# Patient Record
Sex: Male | Born: 1960 | Race: White | Hispanic: No | Marital: Married | Smoking: Never smoker
Health system: Southern US, Community
[De-identification: ages and names within clinical notes are randomized; demographics above are authoritative.]

## PROBLEM LIST (undated history)

## (undated) DIAGNOSIS — E785 Hyperlipidemia, unspecified: Secondary | ICD-10-CM

## (undated) DIAGNOSIS — I639 Cerebral infarction, unspecified: Secondary | ICD-10-CM

## (undated) DIAGNOSIS — R030 Elevated blood-pressure reading, without diagnosis of hypertension: Secondary | ICD-10-CM

## (undated) DIAGNOSIS — K219 Gastro-esophageal reflux disease without esophagitis: Secondary | ICD-10-CM

## (undated) DIAGNOSIS — K644 Residual hemorrhoidal skin tags: Secondary | ICD-10-CM

## (undated) DIAGNOSIS — G459 Transient cerebral ischemic attack, unspecified: Secondary | ICD-10-CM

## (undated) HISTORY — PX: HERNIA REPAIR: SHX51

## (undated) HISTORY — DX: Transient cerebral ischemic attack, unspecified: G45.9

## (undated) HISTORY — DX: Elevated blood-pressure reading, without diagnosis of hypertension: R03.0

## (undated) HISTORY — PX: TONSILLECTOMY: SUR1361

## (undated) HISTORY — DX: Hyperlipidemia, unspecified: E78.5

## (undated) HISTORY — DX: Cerebral infarction, unspecified: I63.9

## (undated) HISTORY — DX: Gastro-esophageal reflux disease without esophagitis: K21.9

## (undated) HISTORY — DX: Residual hemorrhoidal skin tags: K64.4

---

## 2007-05-16 ENCOUNTER — Emergency Department (HOSPITAL_COMMUNITY): Admission: EM | Admit: 2007-05-16 | Discharge: 2007-05-17 | Payer: Self-pay | Admitting: Emergency Medicine

## 2008-08-21 LAB — HM COLONOSCOPY

## 2009-03-22 ENCOUNTER — Ambulatory Visit: Payer: Self-pay | Admitting: Family Medicine

## 2009-03-24 LAB — CONVERTED CEMR LAB
ALT: 25 units/L (ref 0–53)
Albumin: 4.1 g/dL (ref 3.5–5.2)
Alkaline Phosphatase: 54 units/L (ref 39–117)
BUN: 11 mg/dL (ref 6–23)
Bilirubin, Direct: 0.1 mg/dL (ref 0.0–0.3)
Calcium: 9.2 mg/dL (ref 8.4–10.5)
Direct LDL: 154.1 mg/dL
Eosinophils Absolute: 0.1 10*3/uL (ref 0.0–0.7)
Eosinophils Relative: 1.1 % (ref 0.0–5.0)
HCT: 46.2 % (ref 39.0–52.0)
Lymphs Abs: 1.6 10*3/uL (ref 0.7–4.0)
MCHC: 34.5 g/dL (ref 30.0–36.0)
MCV: 90.6 fL (ref 78.0–100.0)
Platelets: 257 10*3/uL (ref 150.0–400.0)
Potassium: 4.5 meq/L (ref 3.5–5.1)
RBC: 5.1 M/uL (ref 4.22–5.81)
RDW: 12.1 % (ref 11.5–14.6)
TSH: 2.26 microintl units/mL (ref 0.35–5.50)
Total CHOL/HDL Ratio: 6
Triglycerides: 241 mg/dL — ABNORMAL HIGH (ref 0.0–149.0)
WBC: 8.2 10*3/uL (ref 4.5–10.5)

## 2009-04-04 ENCOUNTER — Ambulatory Visit: Payer: Self-pay | Admitting: Cardiology

## 2009-04-04 ENCOUNTER — Inpatient Hospital Stay (HOSPITAL_COMMUNITY): Admission: EM | Admit: 2009-04-04 | Discharge: 2009-04-07 | Payer: Self-pay | Admitting: Emergency Medicine

## 2009-04-05 ENCOUNTER — Encounter (INDEPENDENT_AMBULATORY_CARE_PROVIDER_SITE_OTHER): Payer: Self-pay | Admitting: Internal Medicine

## 2009-04-05 ENCOUNTER — Ambulatory Visit: Payer: Self-pay | Admitting: Vascular Surgery

## 2009-04-21 ENCOUNTER — Ambulatory Visit: Payer: Self-pay | Admitting: Gastroenterology

## 2009-04-21 ENCOUNTER — Ambulatory Visit: Payer: Self-pay | Admitting: Family Medicine

## 2009-04-21 DIAGNOSIS — G459 Transient cerebral ischemic attack, unspecified: Secondary | ICD-10-CM | POA: Insufficient documentation

## 2009-04-21 DIAGNOSIS — R030 Elevated blood-pressure reading, without diagnosis of hypertension: Secondary | ICD-10-CM

## 2009-04-21 DIAGNOSIS — E785 Hyperlipidemia, unspecified: Secondary | ICD-10-CM

## 2009-04-21 HISTORY — DX: Transient cerebral ischemic attack, unspecified: G45.9

## 2009-04-21 HISTORY — DX: Hyperlipidemia, unspecified: E78.5

## 2009-04-21 HISTORY — DX: Elevated blood-pressure reading, without diagnosis of hypertension: R03.0

## 2009-05-19 ENCOUNTER — Ambulatory Visit: Payer: Self-pay | Admitting: Gastroenterology

## 2010-02-09 ENCOUNTER — Ambulatory Visit: Payer: Self-pay | Admitting: Family Medicine

## 2010-02-09 DIAGNOSIS — K644 Residual hemorrhoidal skin tags: Secondary | ICD-10-CM | POA: Insufficient documentation

## 2010-02-09 HISTORY — DX: Residual hemorrhoidal skin tags: K64.4

## 2010-05-09 ENCOUNTER — Telehealth: Payer: Self-pay | Admitting: Family Medicine

## 2010-06-01 ENCOUNTER — Encounter: Payer: Self-pay | Admitting: Family Medicine

## 2010-06-10 ENCOUNTER — Encounter: Payer: Self-pay | Admitting: Family Medicine

## 2010-06-10 ENCOUNTER — Ambulatory Visit: Payer: Self-pay | Admitting: Family Medicine

## 2010-07-26 NOTE — Assessment & Plan Note (Signed)
Summary: ?rectal lesion/njr   Vital Signs:  Patient profile:   50 year old male Weight:      226 pounds Temp:     98.9 degrees F oral BP sitting:   110 / 82  (left arm) Cuff size:   regular  Vitals Entered By: Kathrynn Speed CMA (February 09, 2010 1:54 PM) CC: Rectal lesion, src   History of Present Illness: Patient seen with swelling perianal region which he noticed a few days ago. History of recent colonoscopy unremarkable. Minimal if any pain. No bleeding. History of hemorrhoids.  No signif constipation.  Patient has had prior TIA. Questions regarding blood pressure. Stable with home BPs around 120/70. Compliant with Norvasc 2.5 mg daily. Wonders if he needs to maintain medication.  Poor compliance with exercise recently.  NO recurrent TIA symptoms.  Hyperlipidemia treated with Zocor 20 mg daily. Needs followup labs. Has some fatigue issues but no muscle aches.  Current Medications (verified): 1)  No Medications 2)  Norvasc 2.5 Mg Tabs (Amlodipine Besylate) .... Once Daily 3)  Zocor 20 Mg Tabs (Simvastatin) .... Once Daily 4)  Aspirin 325 Mg Tabs (Aspirin) .... Once Daily 5)  Prilosec 20 Mg Cpdr (Omeprazole) .... Twice A Day 6)  Benzonatate 100 Mg Caps (Benzonatate) .... 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    Physical Exam  General:  Well-developed,well-nourished,in no acute distress; alert,appropriate and cooperative throughout examination Head:  Normocephalic and atraumatic without obvious abnormalities. No apparent alopecia or balding. Mouth:  Oral mucosa and oropharynx without lesions or exudates.  Teeth in good repair. Neck:  No deformities, masses, or tenderness noted. Lungs:  Normal respiratory effort, chest expands symmetrically. Lungs are clear to auscultation, no crackles or wheezes. Heart:  Normal rate and regular rhythm. S1 and S2 normal without gallop, murmur, click, rub or other extra sounds. Rectal:  External hemorroid around the 2 O'Clock pos R side-nonthrombosed.   Extremities:  No clubbing, cyanosis, edema, or deformity noted with normal full range of motion of all joints.   Skin:  no rashes.   Cervical Nodes:  No lymphadenopathy noted Psych:  Oriented X3, memory intact for recent and remote, and normally interactive.     Impression & Recommendations:  Problem # 1:  HEMORRHOIDS, EXTERNAL (ICD-455.3) measures to prevent constipation discussed. Warm baths and observe.  Problem # 2:  ELEVATED BLOOD PRESSURE (ICD-796.2) Discussed trial off BP med and close following. His updated medication list for this problem includes:    Norvasc 2.5 Mg Tabs (Amlodipine besylate) ..... Once daily  Problem # 3:  TIA (ICD-435.9) schedule labs for CPE. His updated medication list for this problem includes:    Aspirin 325 Mg Tabs (Aspirin) ..... Once daily  Problem # 4:  DYSLIPIDEMIA (ICD-272.4)  His updated medication list for this problem includes:    Zocor  20 Mg Tabs (Simvastatin) ..... Once daily  Complete Medication List: 1)  No Medications  2)  Norvasc 2.5 Mg Tabs (Amlodipine besylate) .... Once daily 3)  Zocor 20 Mg  Tabs (Simvastatin) .... Once daily 4)  Aspirin 325 Mg Tabs (Aspirin) .... Once daily 5)  Prilosec 20 Mg Cpdr (Omeprazole) .... Twice a day 6)  Benzonatate 100 Mg Caps (Benzonatate) .... One in the morning & one at night  Patient Instructions: 1)  Schedule CPE.

## 2010-07-26 NOTE — Progress Notes (Signed)
Summary: REQ FOR ORDER, labs for CPX  Phone Note Call from Patient   Caller: Patient    [phone] Summary of Call: Pt adv that he would like to have an order for cpx labwork faxed to him at   [phone].Marland Kitchen.(pt scheduled for cpx on 12/16 with Dr Caryl Never)..... Pt adv that he works for [employer] they have a lab in house and he can have his labs drawn there if he has an order... Also, pt adv he had TIA last year and wants to know if there are any labs that need to be ordered in addition to cpx labwork...Marland KitchenMarland KitchenMarland Kitchen Pt can be reached at [phone].  Initial call taken by: Debbra Riding,  May 09, 2010 2:58 PM  Follow-up for Phone Call        will write out order. Follow-up by: Evelena Peat MD,  May 09, 2010 5:33 PM  Additional Follow-up for Phone Call Additional follow up Details #1::        Rx faxed, confirmation received, pt informed on VM Additional Follow-up by: Sid Falcon LPN,  May 10, 2010 9:11 AM

## 2010-07-28 NOTE — Assessment & Plan Note (Signed)
Summary: CPX // RS   Vital Signs:  Patient profile:   50 year old male Height:      70.75 inches Weight:      221 pounds BMI:     31.15 Pulse rate:   80 / minute Pulse rhythm:   regular Resp:     12 per minute BP sitting:   120 / 78  (left arm) Cuff size:   regular  Vitals Entered By: Sid Falcon LPN (June 10, 2010 2:05 PM)  Nutrition Counseling: Patient's BMI is greater than 25 and therefore counseled on weight management options.  History of Present Illness: Here for CPE.  Hx TIA one year ago with no recurrent symptoms. Stopped Amlodipine sec to good BP off meds. Recent labs through work and  mostly favorable. Triglycerides were slightly elev with HDL of 39. LDL at goal (99).  Clinical Review Panels:  Prevention   Last Colonoscopy:  DONE (05/19/2009)   Last PSA:  1.23 (03/22/2009)  Immunizations   Last Tetanus Booster:  Tdap (03/22/2009)   Last Flu Vaccine:  Historical (04/26/2010)   Allergies (verified): No Known Drug Allergies  Past History:  Past Medical History: Last updated: 04/21/2009 Migraines TIA 10/10  Past Surgical History: Last updated: 03/22/2009 Tonsillectomy  Family History: Last updated: 06/10/2010 Family History of Arthritis Family History Breast cancer Mother Family History of Colon CA Aunt Family History High cholesterol Family History Hypertension  Both parents Family history heart disease Father CABG 40  Social History: Last updated: 03/22/2009 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 Alcohol use-yes Regular exercise-yes  Risk Factors: Exercise: yes (03/22/2009)  Risk Factors: Smoking Status: never (03/22/2009) PMH-FH-SH reviewed for relevance  Family History: Family History of Arthritis Family History Breast cancer Mother Family History of Colon CA Aunt Family History High cholesterol Family History Hypertension  Both parents Family history heart disease Father CABG 41  Review of Systems  The patient  denies anorexia, fever, weight loss, weight gain, vision loss, decreased hearing, hoarseness, chest pain, syncope, dyspnea on exertion, peripheral edema, prolonged cough, headaches, hemoptysis, abdominal pain, melena, hematochezia, severe indigestion/heartburn, hematuria, incontinence, genital sores, muscle weakness, suspicious skin lesions, transient blindness, difficulty walking, depression, unusual weight change, abnormal bleeding, enlarged lymph nodes, and testicular masses.         Some continued fatigue issues otherwise feels well.  Physical Exam  General:  Well-developed,well-nourished,in no acute distress; alert,appropriate and cooperative throughout examination Eyes:  No corneal or conjunctival inflammation noted. EOMI. Perrla. Funduscopic exam benign, without hemorrhages, exudates or papilledema. Vision grossly normal. Ears:  External ear exam shows no significant lesions or deformities.  Otoscopic examination reveals clear canals, tympanic membranes are intact bilaterally without bulging, retraction, inflammation or discharge. Hearing is grossly normal bilaterally. Mouth:  Oral mucosa and oropharynx without lesions or exudates.  Teeth in good repair. Neck:  No deformities, masses, or tenderness noted. Lungs:  Normal respiratory effort, chest expands symmetrically. Lungs are clear to auscultation, no crackles or wheezes. Heart:  Normal rate and regular rhythm. S1 and S2 normal without gallop, murmur, click, rub or other extra sounds. Abdomen:  Bowel sounds positive,abdomen soft and non-tender without masses, organomegaly or hernias noted. Msk:  No deformity or scoliosis noted of thoracic or lumbar spine.   Extremities:  No clubbing, cyanosis, edema, or deformity noted with normal full range of motion of all joints.   Skin:  Intact without suspicious lesions or rashes Cervical Nodes:  No lymphadenopathy noted Psych:  normally interactive, good eye contact, not anxious appearing,  and not  depressed appearing.  normally interactive, good eye contact, not anxious appearing, and not depressed appearing.     Impression & Recommendations:  Problem # 1:  ROUTINE GENERAL MEDICAL EXAM@[email or web address]  CARE FACL (ICD-V70.0)  Add omega 3 .  Work on weight loss. Baseline EKG.  EKG shows no acute findings.  Orders: EKG w/ Interpretation (93000)  Complete Medication List: 1)  Zocor 20 Mg Tabs (Simvastatin) .... Once daily 2)  Aspirin 325 Mg Tabs (Aspirin) .... Once daily 3)  Prilosec 20 Mg Cpdr (Omeprazole) .... Twice a day  Patient Instructions: 1)  Consider add omega 3 (fish oil), 2 to 3 gms daily. 2)  It is important that you exercise reguarly at least 20 minutes 5 times a week. If you develop chest pain, have severe difficulty breathing, or feel very tired, stop exercising immediately and seek medical attention.    Orders Added: 1)  Est. Patient 40-64 years [99396] 2)  EKG w/ Interpretation [93000]   Immunization History:  Influenza Immunization History:    Influenza:  historical (04/26/2010)   Immunization History:  Influenza Immunization History:    Influenza:  Historical (04/26/2010)

## 2010-07-28 NOTE — Letter (Signed)
Summary: Health Care Provider Form-Biometric Screening Results  Health Care Provider Form-Biometric Screening Results   Imported By: Maryln Gottron 07/05/2010 09:18:12  _____________________________________________________________________  External Attachment:    Type:   Image     Comment:   External Document

## 2010-09-29 LAB — POCT I-STAT, CHEM 8
BUN: 14 mg/dL (ref 6–23)
Calcium, Ion: 1.13 mmol/L (ref 1.12–1.32)
Chloride: 104 meq/L (ref 96–112)
Creatinine, Ser: 0.9 mg/dL (ref 0.4–1.5)
Glucose, Bld: 94 mg/dL (ref 70–99)
HCT: 45 % (ref 39.0–52.0)
Hemoglobin: 15.3 g/dL (ref 13.0–17.0)
Potassium: 4.2 meq/L (ref 3.5–5.1)
Sodium: 140 meq/L (ref 135–145)
TCO2: 26 mmol/L (ref 0–100)

## 2010-09-29 LAB — PROTIME-INR
INR: 0.94 (ref 0.00–1.49)
Prothrombin Time: 12.5 s (ref 11.6–15.2)

## 2010-09-29 LAB — URINALYSIS, ROUTINE W REFLEX MICROSCOPIC
Bilirubin Urine: NEGATIVE
Glucose, UA: NEGATIVE mg/dL
Hgb urine dipstick: NEGATIVE
Ketones, ur: NEGATIVE mg/dL
Nitrite: NEGATIVE
Protein, ur: NEGATIVE mg/dL
Specific Gravity, Urine: 1.019 (ref 1.005–1.030)
Urobilinogen, UA: 0.2 mg/dL (ref 0.0–1.0)
pH: 6 (ref 5.0–8.0)

## 2010-09-29 LAB — LUPUS ANTICOAGULANT PANEL
PTT Lupus Anticoagulant: 44.3 secs — ABNORMAL HIGH (ref 32.0–43.4)
PTTLA 4:1 Mix: 42.2 secs (ref 36.3–48.8)
dRVVT Incubated 1:1 Mix: 38.8 secs (ref 36.1–47.0)

## 2010-09-29 LAB — ANA: Anti Nuclear Antibody(ANA): NEGATIVE

## 2010-09-29 LAB — DIFFERENTIAL
Basophils Absolute: 0.1 K/uL (ref 0.0–0.1)
Basophils Relative: 1 % (ref 0–1)
Eosinophils Absolute: 0.1 K/uL (ref 0.0–0.7)
Eosinophils Relative: 1 % (ref 0–5)
Lymphocytes Relative: 23 % (ref 12–46)
Lymphs Abs: 2.3 K/uL (ref 0.7–4.0)
Monocytes Absolute: 0.8 K/uL (ref 0.1–1.0)
Monocytes Relative: 8 % (ref 3–12)
Neutro Abs: 6.7 K/uL (ref 1.7–7.7)
Neutrophils Relative %: 67 % (ref 43–77)

## 2010-09-29 LAB — LIPID PANEL
HDL: 35 mg/dL — ABNORMAL LOW
Total CHOL/HDL Ratio: 5.6 ratio
Triglycerides: 296 mg/dL — ABNORMAL HIGH
VLDL: 59 mg/dL — ABNORMAL HIGH (ref 0–40)

## 2010-09-29 LAB — COMPREHENSIVE METABOLIC PANEL WITH GFR
ALT: 42 U/L (ref 0–53)
AST: 36 U/L (ref 0–37)
Albumin: 4.1 g/dL (ref 3.5–5.2)
Alkaline Phosphatase: 61 U/L (ref 39–117)
BUN: 10 mg/dL (ref 6–23)
CO2: 26 meq/L (ref 19–32)
Calcium: 8.9 mg/dL (ref 8.4–10.5)
Chloride: 104 meq/L (ref 96–112)
Creatinine, Ser: 0.92 mg/dL (ref 0.4–1.5)
GFR calc non Af Amer: >60 mL/min
Glucose, Bld: 100 mg/dL — ABNORMAL HIGH (ref 70–99)
Potassium: 4 meq/L (ref 3.5–5.1)
Sodium: 140 meq/L (ref 135–145)
Total Bilirubin: 0.9 mg/dL (ref 0.3–1.2)
Total Protein: 7.8 g/dL (ref 6.0–8.3)

## 2010-09-29 LAB — CBC
HCT: 42.7 % (ref 39.0–52.0)
Hemoglobin: 14.6 g/dL (ref 13.0–17.0)
MCHC: 34.1 g/dL (ref 30.0–36.0)
MCV: 90.2 fL (ref 78.0–100.0)
Platelets: 287 K/uL (ref 150–400)
RBC: 4.73 MIL/uL (ref 4.22–5.81)
RDW: 12 % (ref 11.5–15.5)
WBC: 10.1 K/uL (ref 4.0–10.5)

## 2010-09-29 LAB — PHOSPHORUS: Phosphorus: 3.9 mg/dL (ref 2.3–4.6)

## 2010-09-29 LAB — BETA-2-GLYCOPROTEIN I ABS, IGG/M/A
Beta-2 Glyco I IgG: <3 U/mL
Beta-2-Glycoprotein I IgA: 4 U/mL
Beta-2-Glycoprotein I IgM: <3 U/mL

## 2010-09-29 LAB — PROTEIN C ACTIVITY: Protein C Activity: 200 % — ABNORMAL HIGH (ref 75–133)

## 2010-09-29 LAB — ANTITHROMBIN III: AntiThromb III Func: 116 % (ref 76–126)

## 2010-09-29 LAB — CARDIOLIPIN ANTIBODIES, IGG, IGM, IGA
Anticardiolipin IgA: 8 U/mL — ABNORMAL LOW
Anticardiolipin IgG: 3 GPL U/mL — ABNORMAL LOW
Anticardiolipin IgM: <3 [MPL'U]/mL — ABNORMAL LOW

## 2010-09-29 LAB — PROTHROMBIN GENE MUTATION

## 2010-09-29 LAB — URINE CULTURE

## 2010-09-29 LAB — APTT: aPTT: 28 s (ref 24–37)

## 2010-09-29 LAB — HOMOCYSTEINE: Homocysteine: 10.8 umol/L (ref 4.0–15.4)

## 2010-09-29 LAB — PROTEIN S ACTIVITY: Protein S Activity: 112 % (ref 69–129)

## 2011-05-27 ENCOUNTER — Other Ambulatory Visit: Payer: Self-pay | Admitting: Family Medicine

## 2011-07-11 ENCOUNTER — Other Ambulatory Visit: Payer: Self-pay | Admitting: Family Medicine

## 2011-07-13 ENCOUNTER — Telehealth: Payer: Self-pay | Admitting: Family Medicine

## 2011-07-13 MED ORDER — SIMVASTATIN 20 MG PO TABS: 20.0000 mg | ORAL_TABLET | Freq: Every day | ORAL | Status: DC

## 2011-07-13 NOTE — Telephone Encounter (Signed)
[facility]. Pt has made appt for 08-22-2011. Pt is out of meds.

## 2011-08-07 ENCOUNTER — Telehealth: Payer: Self-pay | Admitting: Family Medicine

## 2011-08-07 NOTE — Telephone Encounter (Signed)
Patient is requesting an order for cpx labs be sent to his job tomorrow. Faxed attention Tammy: [phone]. [place] will be leaving the country and his cpx is scheduled for 08/22/11.

## 2011-08-08 ENCOUNTER — Telehealth: Payer: Self-pay | Admitting: *Deleted

## 2011-08-08 NOTE — Telephone Encounter (Signed)
Written order faxed, confirmation received 

## 2011-08-08 NOTE — Telephone Encounter (Signed)
Cbc, bmp, ua, lipid, hepatic, anything else?

## 2011-08-08 NOTE — Telephone Encounter (Signed)
Also add TSH and PSA

## 2011-08-18 ENCOUNTER — Encounter: Payer: Self-pay | Admitting: Family Medicine

## 2011-08-22 ENCOUNTER — Encounter: Payer: Self-pay | Admitting: Family Medicine

## 2011-08-22 ENCOUNTER — Ambulatory Visit (INDEPENDENT_AMBULATORY_CARE_PROVIDER_SITE_OTHER): Payer: Managed Care, Other (non HMO) | Admitting: Family Medicine

## 2011-08-22 VITALS — BP 108/68 | HR 72 | Temp 98.3°F | Resp 12 | Ht 71.75 in | Wt 215.0 lb

## 2011-08-22 DIAGNOSIS — Z0000 Encounter for general adult medical examination without abnormal findings: Secondary | ICD-10-CM

## 2011-08-22 MED ORDER — SILDENAFIL CITRATE 100 MG PO TABS: 50.0000 mg | ORAL_TABLET | Freq: Every day | ORAL | Status: DC | PRN

## 2011-08-22 NOTE — Progress Notes (Signed)
  Subjective:    Patient ID: Chris Mendez, male    DOB: Jun 15, 1961, 51 y.o.   MRN: [id number]  HPI  Patient here for complete physical. He is exercising regularly and excellent job with lifestyle management. Has lost some weight. Still has some issues with building muscle mass and has questioned low testosterone. Occasional problems with erectile dysfunction. Good libido. Nonsmoker. Immunizations up to date. Colonoscopy up to date.  Past Medical History  Diagnosis Date  . DYSLIPIDEMIA 04/21/2009  . TIA 04/21/2009  . HEMORRHOIDS, EXTERNAL 02/09/2010  . ELEVATED BLOOD PRESSURE 04/21/2009   Past Surgical History  Procedure Date  . Tonsillectomy     reports that he has never smoked. He does not have any smokeless tobacco history on file. His alcohol and drug histories not on file. family history includes Cancer in his maternal aunt and mother; Heart disease (age of onset:67) in his father; Hyperlipidemia in his father; and Hypertension in his father and mother. No Known Allergies   Review of Systems  Constitutional: Negative for fever, activity change, appetite change and fatigue.  HENT: Negative for ear pain, congestion and trouble swallowing.   Eyes: Negative for pain and visual disturbance.  Respiratory: Negative for cough, shortness of breath and wheezing.   Cardiovascular: Negative for chest pain and palpitations.  Gastrointestinal: Negative for nausea, vomiting, abdominal pain, diarrhea, constipation, blood in stool, abdominal distention and rectal pain.  Genitourinary: Negative for dysuria, hematuria and testicular pain.  Musculoskeletal: Negative for joint swelling and arthralgias.  Skin: Negative for rash.  Neurological: Negative for dizziness, syncope and headaches.  Hematological: Negative for adenopathy.  Psychiatric/Behavioral: Negative for confusion and dysphoric mood.       Objective:   Physical Exam  Constitutional: He is oriented to person, place, and time. He  appears well-developed and well-nourished. No distress.  HENT:  Head: Normocephalic and atraumatic.  Right Ear: External ear normal.  Left Ear: External ear normal.  Mouth/Throat: Oropharynx is clear and moist.  Eyes: Conjunctivae and EOM are normal. Pupils are equal, round, and reactive to light.  Neck: Normal range of motion. Neck supple. No thyromegaly present.  Cardiovascular: Normal rate, regular rhythm and normal heart sounds.   No murmur heard. Pulmonary/Chest: No respiratory distress. He has no wheezes. He has no rales.  Abdominal: Soft. Bowel sounds are normal. He exhibits no distension and no mass. There is no tenderness. There is no rebound and no guarding.  Musculoskeletal: He exhibits no edema.  Lymphadenopathy:    He has no cervical adenopathy.  Neurological: He is alert and oriented to person, place, and time. He displays normal reflexes. No cranial nerve deficit.  Skin: No rash noted.  Psychiatric: He has a normal mood and affect.          Assessment & Plan:  Complete physical. Labs reviewed with patient. Lipid parameters have improved. Continue with antilipid therapy and omega-3 supplement. Patient has erectile dysfunction. Trial of Viagra 100 mg once daily as needed

## 2011-08-22 NOTE — Patient Instructions (Signed)
Continue with weight loss and exercise efforts. 

## 2011-08-23 ENCOUNTER — Encounter: Payer: Self-pay | Admitting: Family Medicine

## 2011-08-28 MED ORDER — SIMVASTATIN 20 MG PO TABS: 20.0000 mg | ORAL_TABLET | Freq: Every day | ORAL | Status: DC

## 2011-08-28 NOTE — Telephone Encounter (Signed)
Pt needs viagra 100mg  and simvastatin 20 mg call [facility] [address]

## 2011-10-02 ENCOUNTER — Encounter (INDEPENDENT_AMBULATORY_CARE_PROVIDER_SITE_OTHER): Payer: Managed Care, Other (non HMO) | Admitting: Ophthalmology

## 2011-10-02 DIAGNOSIS — H2510 Age-related nuclear cataract, unspecified eye: Secondary | ICD-10-CM

## 2011-10-02 DIAGNOSIS — H5210 Myopia, unspecified eye: Secondary | ICD-10-CM

## 2011-10-02 DIAGNOSIS — H43819 Vitreous degeneration, unspecified eye: Secondary | ICD-10-CM

## 2011-10-02 DIAGNOSIS — H3560 Retinal hemorrhage, unspecified eye: Secondary | ICD-10-CM

## 2011-11-13 ENCOUNTER — Encounter (INDEPENDENT_AMBULATORY_CARE_PROVIDER_SITE_OTHER): Payer: Managed Care, Other (non HMO) | Admitting: Ophthalmology

## 2011-11-13 DIAGNOSIS — H43819 Vitreous degeneration, unspecified eye: Secondary | ICD-10-CM

## 2011-11-13 DIAGNOSIS — H2510 Age-related nuclear cataract, unspecified eye: Secondary | ICD-10-CM

## 2012-06-11 ENCOUNTER — Other Ambulatory Visit: Payer: Self-pay | Admitting: *Deleted

## 2012-06-11 MED ORDER — SIMVASTATIN 20 MG PO TABS: 20.0000 mg | ORAL_TABLET | Freq: Every day | ORAL | Status: DC

## 2013-04-03 ENCOUNTER — Ambulatory Visit (INDEPENDENT_AMBULATORY_CARE_PROVIDER_SITE_OTHER): Payer: Commercial Indemnity | Admitting: Surgery

## 2013-04-03 ENCOUNTER — Encounter (INDEPENDENT_AMBULATORY_CARE_PROVIDER_SITE_OTHER): Payer: Self-pay | Admitting: Surgery

## 2013-04-03 VITALS — BP 122/80 | HR 64 | Temp 98.0°F | Resp 16 | Ht 72.0 in | Wt 207.6 lb

## 2013-04-03 DIAGNOSIS — K4090 Unilateral inguinal hernia, without obstruction or gangrene, not specified as recurrent: Secondary | ICD-10-CM

## 2013-04-03 NOTE — Progress Notes (Signed)
Re:   Chris Mendez DOB:   22-Jun-2011 MRN:   [id number]  ASSESSMENT AND PLAN: 1.  Inguinal Hernia, Right  I discussed the indications and complications of hernia surgery with the patient.  I discussed both the laparoscopic and open approach to hernia repair.  The potential risks of hernia surgery include, but are not limited to, bleeding, infection, open surgery, nerve injury, and recurrence of the hernia.  I provided the patient literature about hernia surgery.  Plan:  To check his schedule and call back about surgery. He may wait until winter.  He has to decide between a laparoscopic or open repair.  2. History of TIA 3 years ago - no further symptoms.  - Currently on aspirin  Chief Complaint  Patient presents with  . Inguinal Hernia    new pt   REFERRING PHYSICIAN: Kristian Covey, MD  HISTORY OF PRESENT ILLNESS: Chris Mendez is a 52 y.o. (DOB: 08-04-1960)  white  male whose primary care physician is Kristian Covey, MD and comes to me today for evaluation of right inguinal hernia. He comes by himself. He has noticed some pain and discomfort in his right groin for about 6 months.  He was diagnosed with an inguinal hernia by his nurse practitioner, Marylene Land, at work at [employer] center. It is a burning sensation that is accompanied by numbness. It is exacerbated by running and playing golf.  The pain starts about 4.5 miles into his run.  He has some numbness across his lower abdomen, just above the pubic bone.  No history of hernia surgery.  He is planning on running a 5 K this weekend.   Past Medical History  Diagnosis Date  . DYSLIPIDEMIA 04/21/2009  . TIA 04/21/2009  . HEMORRHOIDS, EXTERNAL 02/09/2010  . ELEVATED BLOOD PRESSURE 04/21/2009  . GERD (gastroesophageal reflux disease)     Past Surgical History  Procedure Laterality Date  . Tonsillectomy       Current Outpatient Prescriptions  Medication Sig Dispense Refill  . aspirin 81 MG tablet Take 81 mg by mouth  daily.      . sildenafil (VIAGRA) 100 MG tablet Take 0.5-1 tablets (50-100 mg total) by mouth daily as needed for erectile dysfunction.  5 tablet  11   No current facility-administered medications for this visit.    No Known Allergies  REVIEW OF SYSTEMS: Skin:  No history of rash.  No history of abnormal moles. Infection:  No history of hepatitis or HIV.  No history of MRSA. Neurologic:  History of TIA in 2011. Taking Aspirin.  Cardiac:  No history of hypertension. No history of heart disease.  No history of prior cardiac catheterization.  No history of seeing a cardiologist. Pulmonary:  Does not smoke cigarettes.  No asthma or bronchitis.  No OSA/CPAP. Endocrine:  No diabetes. No thyroid disease. Gastrointestinal:  No history of stomach disease.  No history of liver disease.  No history of gall bladder disease.  No history of pancreas disease.  Colonoscopy by [facility] (? Physician) Urologic:  No history of kidney stones.  No history of bladder infections. Musculoskeletal:  No history of joint or back disease. Hematologic:  No bleeding disorder.  No history of anemia.  Not anticoagulated. Psycho-social:  The patient is oriented.   The patient has no obvious psychologic or social impairment to understanding our conversation and plan.  SOCIAL and FAMILY HISTORY: Married and works for [employer] in [employer]. He lives in [place], but works in [place].  PHYSICAL EXAM: BP 122/80  Pulse 64  Temp(Src) 98 F (36.7 C) (Temporal)  Resp 16  Ht 6' (1.829 m)  Wt 207 lb 9.6 oz (94.167 kg)  BMI 28.15 kg/m2  General: middle aged white male who is alert and generally healthy appearing.  Has reddish complexion. HEENT: Normal. Pupils equal. Neck: Supple. No mass.  No thyroid mass. Lymph Nodes:  No supraclavicular or cervical nodes. Lungs: Clear to auscultation and symmetric breath sounds. Heart:  RRR. No murmur or rub. Abdomen: Soft. No mass. No tenderness. Medium sized inguinal  hernia present on right side.  Reducible. Rectal: Not done. Extremities:  Good strength and ROM  in upper and lower extremities. Neurologic:  Grossly intact to motor and sensory function. Psychiatric: Has normal mood and affect. Behavior is normal.   DATA REVIEWED: Epic notes.  Ovidio Kin, MD,  [facility] [address]  [address]   [place], [place]    [address] Phone:  [phone] FAX:  [phone]

## 2013-04-23 ENCOUNTER — Encounter (INDEPENDENT_AMBULATORY_CARE_PROVIDER_SITE_OTHER): Payer: Self-pay

## 2013-04-28 ENCOUNTER — Telehealth (INDEPENDENT_AMBULATORY_CARE_PROVIDER_SITE_OTHER): Payer: Self-pay | Admitting: Otolaryngology

## 2013-04-28 NOTE — Telephone Encounter (Signed)
Pt called [facility] wanting his surgery w Dr Ezzard Standing scheduled Friday before thanksgiving.  No orders  Pt wanted to do laparoscopic

## 2013-04-29 ENCOUNTER — Telehealth (INDEPENDENT_AMBULATORY_CARE_PROVIDER_SITE_OTHER): Payer: Self-pay | Admitting: Surgery

## 2013-04-29 NOTE — Telephone Encounter (Signed)
Pt called [facility] wanting his surgery w Dr Newman scheduled Friday before thanksgiving.  No orders  °Pt wanted to do laparoscopic  °

## 2013-05-05 ENCOUNTER — Other Ambulatory Visit (INDEPENDENT_AMBULATORY_CARE_PROVIDER_SITE_OTHER): Payer: Self-pay | Admitting: Surgery

## 2013-05-15 DIAGNOSIS — K4090 Unilateral inguinal hernia, without obstruction or gangrene, not specified as recurrent: Secondary | ICD-10-CM

## 2013-05-16 ENCOUNTER — Other Ambulatory Visit (INDEPENDENT_AMBULATORY_CARE_PROVIDER_SITE_OTHER): Payer: Self-pay | Admitting: *Deleted

## 2013-05-16 MED ORDER — HYDROCODONE-ACETAMINOPHEN 5-300 MG PO TABS: 1.0000 | ORAL_TABLET | ORAL | Status: DC | PRN

## 2013-06-04 ENCOUNTER — Encounter (INDEPENDENT_AMBULATORY_CARE_PROVIDER_SITE_OTHER): Payer: Self-pay

## 2013-06-04 ENCOUNTER — Ambulatory Visit (INDEPENDENT_AMBULATORY_CARE_PROVIDER_SITE_OTHER): Payer: Commercial Indemnity | Admitting: Surgery

## 2013-06-04 ENCOUNTER — Encounter (INDEPENDENT_AMBULATORY_CARE_PROVIDER_SITE_OTHER): Payer: Self-pay | Admitting: Surgery

## 2013-06-04 VITALS — BP 112/68 | HR 76 | Temp 98.6°F | Resp 15 | Ht 72.0 in | Wt 206.0 lb

## 2013-06-04 DIAGNOSIS — K4090 Unilateral inguinal hernia, without obstruction or gangrene, not specified as recurrent: Secondary | ICD-10-CM

## 2013-06-04 NOTE — Progress Notes (Addendum)
   Re:   Chris Mendez DOB:   1960/11/19 MRN:   [id number]  ASSESSMENT AND PLAN: 1.  Right Inguinal Hernia repair, laparoscopic - 05/15/2013 - D. Ezzard Standing  Repaired at [facility].  Doing well.  Return appointment is PRN.   2. History of TIA 3 years ago - no further symptoms.  - Currently on aspirin  3.  Mild right subcostal pain - ?costochronditis.  He'll watch it and discuss it with his PCP if he has more problems.  Chief Complaint  Patient presents with  . Routine Post Op    sx 05/15/13 hernia repair   REFERRING PHYSICIAN: Kristian Covey, MD  HISTORY OF PRESENT ILLNESS: Chris Mendez is a 52 y.o. (DOB: [birth year])  white  male whose primary care physician is Kristian Covey, MD and comes to me today for follow up of right inguinal hernia repair.  He has done well from the hernia repair.  He has had minimal discomfort.  He talked about a buddy who had "nerve pain" post hernia repair.  We discussed this some. He had one question about some discomfort at his right lower chest, at the costochondral margin.  This does not appear to be related to diet.  Maybe some with sitting certain ways.  He notices this in the car, when he is seated, with the seat belt on.   Past Medical History  Diagnosis Date  . DYSLIPIDEMIA 04/21/2009  . TIA 04/21/2009  . HEMORRHOIDS, EXTERNAL 02/09/2010  . ELEVATED BLOOD PRESSURE 04/21/2009  . GERD (gastroesophageal reflux disease)     Past Surgical History  Procedure Laterality Date  . Tonsillectomy    . Hernia repair       Current Outpatient Prescriptions  Medication Sig Dispense Refill  . aspirin 81 MG tablet Take 81 mg by mouth daily.       No current facility-administered medications for this visit.    No Known Allergies  REVIEW OF SYSTEMS: Neurologic:  History of TIA in 2011. Taking Aspirin.  Gastrointestinal:  No history of stomach disease.  No history of liver disease.  No history of gall bladder disease.  No history  of pancreas disease.  Colonoscopy by [facility] (? Physician)  SOCIAL and FAMILY HISTORY: Married and works for [employer] in [employer]. He lives in [place], but works in [place].  PHYSICAL EXAM: BP 112/68  Pulse 76  Temp(Src) 98.6 F (37 C) (Temporal)  Resp 15  Ht 6' (1.829 m)  Wt 206 lb (93.441 kg)  BMI 27.93 kg/m2  General: middle aged white male who is alert and generally healthy appearing.  Has reddish complexion. Lungs: Clear to auscultation and symmetric breath sounds.  Some pain at the right subcostal area, but no mass or peritoneal signs.  I'm not sure if his pain is not costochondritis. Abdomen: Soft. No mass. No tenderness. Incisions look good.  DATA REVIEWED: Epic notes.  Ovidio Kin, MD,  [facility] [address]  [address]   [place], [place]    [address] Phone:  [phone] FAX:  [phone]

## 2014-06-27 ENCOUNTER — Ambulatory Visit (INDEPENDENT_AMBULATORY_CARE_PROVIDER_SITE_OTHER): Payer: Managed Care, Other (non HMO) | Admitting: Family Medicine

## 2014-06-27 ENCOUNTER — Encounter: Payer: Self-pay | Admitting: Family Medicine

## 2014-06-27 VITALS — BP 120/80 | HR 74 | Temp 98.3°F | Ht 72.0 in | Wt 216.5 lb

## 2014-06-27 DIAGNOSIS — J069 Acute upper respiratory infection, unspecified: Secondary | ICD-10-CM

## 2014-06-27 DIAGNOSIS — J209 Acute bronchitis, unspecified: Secondary | ICD-10-CM

## 2014-06-27 NOTE — Progress Notes (Signed)
Pre visit review using our clinic review tool, if applicable. No additional management support is needed unless otherwise documented below in the visit note. 

## 2014-06-27 NOTE — Progress Notes (Signed)
OFFICE NOTE  06/27/2014  CC:  Chief Complaint  Patient presents with  . Nasal Congestion    C/O chest congestion & productive cough started 3 days ago-feels better today   HPI: Patient is a 54 y.o.  male who is here for respiratory symptoms. Onset about 6d/a, seems to have peaked a couple of days ago. Nasal congestion, PND with cough.  In early part of illness he had some subjective fever, wheezing, chest tightness but this all resolved in the last 1-2 days.  Cold-EZ otc and dayquil/nyquil being tried.  HA at onset but this has resolved.   No ST.  No n/v/d.  He is not a smoker.  Pertinent PMH:  PMH and PSH reviewed.  MEDS:  Outpatient Prescriptions Prior to Visit  Medication Sig Dispense Refill  . aspirin 81 MG tablet Take 81 mg by mouth daily.     No facility-administered medications prior to visit.    PE: Blood pressure 120/80, pulse 74, temperature 98.3 F (36.8 C), temperature source Oral, height 6' (1.829 m), weight 216 lb 8 oz (98.204 kg), SpO2 97 %. VS: noted--normal. Gen: alert, NAD, NONTOXIC APPEARING. HEENT: eyes without injection, drainage, or swelling.  Ears: EACs clear, TMs with normal light reflex and landmarks.  Nose: Clear rhinorrhea, with some dried, crusty exudate adherent to mildly injected mucosa.  No purulent d/c.  No paranasal sinus TTP.  No facial swelling.  Throat and mouth without focal lesion.  No pharyngial swelling, erythema, or exudate.   Neck: supple, no LAD.   LUNGS: CTA bilat, nonlabored resps.   CV: RRR, no m/r/g. EXT: no c/c/e SKIN: no rash    IMPRESSION AND PLAN:  Viral URI with bronchitis. No sign of RAD.  He is beginning to improve. Mucinex DM, saline nasal spray, rest, fluids. Signs/symptoms to call or return for were reviewed and pt expressed understanding.  An After Visit Summary was printed and given to the patient.  FOLLOW UP: prn

## 2015-03-31 ENCOUNTER — Encounter: Payer: Self-pay | Admitting: Gastroenterology

## 2015-12-14 ENCOUNTER — Emergency Department (HOSPITAL_COMMUNITY)
Admission: EM | Admit: 2015-12-14 | Discharge: 2015-12-14 | Disposition: A | Discharge status: Home / Self Care | Payer: Managed Care, Other (non HMO) | Attending: Emergency Medicine | Admitting: Emergency Medicine

## 2015-12-14 ENCOUNTER — Other Ambulatory Visit: Payer: Self-pay

## 2015-12-14 ENCOUNTER — Emergency Department (HOSPITAL_COMMUNITY): Payer: Managed Care, Other (non HMO)

## 2015-12-14 ENCOUNTER — Encounter (HOSPITAL_COMMUNITY): Payer: Self-pay | Admitting: Family Medicine

## 2015-12-14 DIAGNOSIS — R531 Weakness: Secondary | ICD-10-CM | POA: Diagnosis present

## 2015-12-14 DIAGNOSIS — Z5181 Encounter for therapeutic drug level monitoring: Secondary | ICD-10-CM | POA: Diagnosis not present

## 2015-12-14 DIAGNOSIS — H538 Other visual disturbances: Secondary | ICD-10-CM | POA: Insufficient documentation

## 2015-12-14 DIAGNOSIS — Z79899 Other long term (current) drug therapy: Secondary | ICD-10-CM | POA: Insufficient documentation

## 2015-12-14 DIAGNOSIS — Z8673 Personal history of transient ischemic attack (TIA), and cerebral infarction without residual deficits: Secondary | ICD-10-CM | POA: Insufficient documentation

## 2015-12-14 LAB — CBC
HEMATOCRIT: 43 % (ref 39.0–52.0)
Hemoglobin: 14.3 g/dL (ref 13.0–17.0)
MCH: 29.4 pg (ref 26.0–34.0)
MCHC: 33.3 g/dL (ref 30.0–36.0)
MCV: 88.5 fL (ref 78.0–100.0)
PLATELETS: 324 10*3/uL (ref 150–400)
RBC: 4.86 MIL/uL (ref 4.22–5.81)
RDW: 12.8 % (ref 11.5–15.5)
WBC: 7.7 10*3/uL (ref 4.0–10.5)

## 2015-12-14 LAB — COMPREHENSIVE METABOLIC PANEL
ALK PHOS: 59 U/L (ref 38–126)
ALT: 26 U/L (ref 17–63)
ANION GAP: 6 (ref 5–15)
AST: 23 U/L (ref 15–41)
Albumin: 4 g/dL (ref 3.5–5.0)
BUN: 11 mg/dL (ref 6–20)
CALCIUM: 9.5 mg/dL (ref 8.9–10.3)
CHLORIDE: 106 mmol/L (ref 101–111)
CO2: 26 mmol/L (ref 22–32)
CREATININE: 0.96 mg/dL (ref 0.61–1.24)
Glucose, Bld: 101 mg/dL — ABNORMAL HIGH (ref 65–99)
Potassium: 4.1 mmol/L (ref 3.5–5.1)
SODIUM: 138 mmol/L (ref 135–145)
Total Bilirubin: 0.5 mg/dL (ref 0.3–1.2)
Total Protein: 7 g/dL (ref 6.5–8.1)

## 2015-12-14 LAB — PROTIME-INR
INR: 1 (ref 0.00–1.49)
PROTHROMBIN TIME: 13.4 s (ref 11.6–15.2)

## 2015-12-14 LAB — DIFFERENTIAL
BASOS PCT: 0 %
Basophils Absolute: 0 10*3/uL (ref 0.0–0.1)
EOS PCT: 1 %
Eosinophils Absolute: 0.1 10*3/uL (ref 0.0–0.7)
Lymphocytes Relative: 18 %
Lymphs Abs: 1.4 10*3/uL (ref 0.7–4.0)
MONO ABS: 0.6 10*3/uL (ref 0.1–1.0)
MONOS PCT: 8 %
NEUTROS ABS: 5.7 10*3/uL (ref 1.7–7.7)
Neutrophils Relative %: 73 %

## 2015-12-14 LAB — I-STAT CHEM 8, ED
BUN: 13 mg/dL (ref 6–20)
CALCIUM ION: 1.19 mmol/L (ref 1.12–1.23)
Chloride: 104 mmol/L (ref 101–111)
Creatinine, Ser: 1 mg/dL (ref 0.61–1.24)
GLUCOSE: 98 mg/dL (ref 65–99)
HCT: 45 % (ref 39.0–52.0)
HEMOGLOBIN: 15.3 g/dL (ref 13.0–17.0)
Potassium: 4.2 mmol/L (ref 3.5–5.1)
SODIUM: 141 mmol/L (ref 135–145)
TCO2: 28 mmol/L (ref 0–100)

## 2015-12-14 LAB — APTT: aPTT: 32 seconds (ref 24–37)

## 2015-12-14 LAB — I-STAT TROPONIN, ED: TROPONIN I, POC: 0 ng/mL (ref 0.00–0.08)

## 2015-12-14 MED ORDER — GADOBENATE DIMEGLUMINE 529 MG/ML IV SOLN
20.0000 mL | Freq: Once | INTRAVENOUS | Status: AC | PRN
  Administered 2015-12-14: 20 mL via INTRAVENOUS

## 2015-12-14 MED ORDER — ACETAMINOPHEN 325 MG PO TABS
650.0000 mg | ORAL_TABLET | Freq: Once | ORAL | Status: AC
  Administered 2015-12-14: 650 mg via ORAL
  Filled 2015-12-14: qty 2

## 2015-12-14 MED ORDER — SODIUM CHLORIDE 0.9 % IV BOLUS (SEPSIS): 500.0000 mL | Freq: Once | INTRAVENOUS | Status: DC

## 2015-12-14 MED ORDER — KETOROLAC TROMETHAMINE 30 MG/ML IJ SOLN
30.0000 mg | Freq: Once | INTRAMUSCULAR | Status: DC
  Filled 2015-12-14: qty 1

## 2015-12-14 NOTE — ED Notes (Signed)
MD at bedside. 

## 2015-12-14 NOTE — ED Provider Notes (Signed)
CSN: [id number]     Arrival date & time 12/14/15  1339 History   First MD Initiated Contact with Patient 12/14/15 1441     Chief Complaint  Patient presents with  . Weakness     (Consider location/radiation/quality/duration/timing/severity/associated sxs/prior Treatment) HPI Comments: Chris Mendez is a 55 y.o. male with history of TIA in 2010 and GERD presents to ED via EMS with complaint of blurry vision and off balance. Patient reports symptoms started around 12pm today. He first noticed blurry vision while looking at the computer screen. He got up to check and see if his long distance vision was blurry and felt "off balanced" as if he was "walking on a crooked floor." He endorses blurry vision lasted for approximately 15 minutes and has since resolved. He still feels unsteady on his feet and a little disoriented, he states he feels as if he "too pain medication." he has a mild headache, 2-3 on pain scale in occipiatel region of his head. He denies loss of consciousness, dizziness, lightheadedness, seizure activity, head trauma, chest pain, or shortness of breath. No abdominal sxs. No urinary sxs. He does not smoke. He drinks a "couple beers" a week. Denies recreational drug use. No recent changes in medication.   Patient is a 55 y.o. male presenting with weakness. The history is provided by the patient.  Weakness Associated symptoms include weakness.    Past Medical History  Diagnosis Date  . DYSLIPIDEMIA 04/21/2009  . TIA 04/21/2009  . HEMORRHOIDS, EXTERNAL 02/09/2010  . ELEVATED BLOOD PRESSURE 04/21/2009  . GERD (gastroesophageal reflux disease)    Past Surgical History  Procedure Laterality Date  . Tonsillectomy    . Hernia repair     Family History  Problem Relation Age of Onset  . Hypertension Mother   . Cancer Mother     thalmus  . Hypertension Father   . Hyperlipidemia Father   . Heart disease Father 80    cabg  . Cancer Maternal Aunt     breast  . Cancer Maternal  Grandmother     breast   Social History  Substance Use Topics  . Smoking status: Never Smoker   . Smokeless tobacco: None  . Alcohol Use: Yes    Review of Systems  Eyes: Positive for visual disturbance ( blurry vision, since resolved).  Neurological: Positive for weakness.       Off balance sensation  All other systems reviewed and are negative.     Allergies  Review of patient's allergies indicates no known allergies.  Home Medications   Prior to Admission medications   Medication Sig Start Date End Date Taking? Authorizing Provider  MAGNESIUM PO Take 1 tablet by mouth daily.   Yes Historical Provider, MD  Probiotic Product (PROBIOTIC PO) Take 1 tablet by mouth daily.   Yes Historical Provider, MD  Pyridoxine HCl (VITAMIN B-6 PO) Take 1 tablet by mouth daily.   Yes Historical Provider, MD   BP 120/68 mmHg  Pulse 69  Temp(Src) 98.2 F (36.8 C) (Oral)  Resp 17  SpO2 97% Physical Exam  Constitutional: He appears well-developed and well-nourished. No distress.  HENT:  Head: Normocephalic and atraumatic.  Mouth/Throat: Oropharynx is clear and moist. No oropharyngeal exudate.  Eyes: Conjunctivae and EOM are normal. Pupils are equal, round, and reactive to light. Right eye exhibits no discharge. Left eye exhibits no discharge. No scleral icterus.  Neck: Normal range of motion. Neck supple.  Cardiovascular: Normal rate, regular rhythm, normal heart sounds and intact  distal pulses.   No murmur heard. Pulmonary/Chest: Effort normal and breath sounds normal. No respiratory distress.  Abdominal: Soft. Bowel sounds are normal. There is no tenderness. There is no rebound and no guarding.  Musculoskeletal: Normal range of motion.  Lymphadenopathy:    He has no cervical adenopathy.  Neurological: He is alert. He displays a negative Romberg sign. Coordination normal.  Mental Status:  Alert, thought content appropriate, able to give a coherent history. Speech fluent without  evidence of aphasia. Able to follow 2 step commands without difficulty.  Cranial Nerves:  II:  Peripheral visual fields grossly normal, pupils equal, round, reactive to light III,IV, VI: ptosis not present, extra-ocular motions intact bilaterally  V,VII: smile symmetric, facial light touch sensation equal VIII: hearing grossly normal to voice  X: uvula elevates symmetrically  XI: bilateral shoulder shrug symmetric and strong XII: midline tongue extension without fassiculations Motor:  Normal tone. 5/5 in upper and lower extremities bilaterally including strong and equal grip strength and dorsiflexion/plantar flexion Sensory: Pinprick and light touch normal in all extremities.  Cerebellar: normal finger-to-nose with bilateral upper extremities Gait: normal gait and balance CV: distal pulses palpable throughout     Skin: Skin is warm and dry. He is not diaphoretic.  Psychiatric: He has a normal mood and affect.    ED Course  Procedures (including critical care time) Labs Review Labs Reviewed  COMPREHENSIVE METABOLIC PANEL - Abnormal; Notable for the following:    Glucose, Bld 101 (*)    All other components within normal limits  PROTIME-INR  APTT  CBC  DIFFERENTIAL  I-STAT TROPOININ, ED  I-STAT CHEM 8, ED  CBG MONITORING, ED    Imaging Review Ct Head Wo Contrast  12/14/2015  CLINICAL DATA:  Sudden onset of weakness, blurred vision, unsteady gait and disorientation today, history TIA, initial encounter EXAM: CT HEAD WITHOUT CONTRAST TECHNIQUE: Contiguous axial images were obtained from the base of the skull through the vertex without intravenous contrast. COMPARISON:  04/04/2009 FINDINGS: Normal ventricular morphology. No midline shift or mass effect. Normal appearance of brain parenchyma. No intracranial hemorrhage, mass lesion or evidence acute infarction. No extra-axial fluid collections. Visualized paranasal sinuses and mastoid air cells clear. Bones unremarkable. IMPRESSION:  Normal exam. Electronically Signed   By: Lavonia Dana M.D.   On: 12/14/2015 14:40   Mr Jeri Cos [id number] Contrast  12/14/2015  CLINICAL DATA:  Blurred vision, unsteady balance, and weakness today. EXAM: MRI HEAD WITHOUT AND WITH CONTRAST MRA HEAD WITHOUT CONTRAST TECHNIQUE: Multiplanar, multiecho pulse sequences of the brain and surrounding structures were obtained without and with intravenous contrast. Angiographic images of the head were obtained using MRA technique without contrast. CONTRAST:  54mL MULTIHANCE GADOBENATE DIMEGLUMINE 529 MG/ML IV SOLN COMPARISON:  Head CT 12/14/2015.  Head MRI and MRA 04/05/2009. FINDINGS: MRI HEAD FINDINGS There is no evidence of acute infarct, intracranial hemorrhage, mass, midline shift, or extra-axial fluid collection. The ventricles and sulci are normal. The brain is normal in signal. No abnormal enhancement is identified. Orbits are unremarkable. Paranasal sinuses and mastoid air cells are clear. Major intracranial vascular flow voids are preserved. Bone marrow signal is unremarkable. MRA HEAD FINDINGS The visualized distal vertebral arteries are widely patent and codominant. PICA and SCA origins are patent. Basilar artery is widely patent. There are likely tiny posterior communicating arteries bilaterally. PCAs are unremarkable. Partially visualized tortuous distal cervical ICA on the left. The intracranial ICAs are widely patent. ACAs and MCAs are patent without evidence of major branch occlusion  or significant proximal stenosis. No intracranial aneurysm is identified. IMPRESSION: 1. Unremarkable brain MRI without evidence of acute abnormality. 2. Negative head MRA. Electronically Signed   By: Logan Bores M.D.   On: 12/14/2015 18:42   Mr Jodene Nam Head (cerebral Arteries)  12/14/2015  CLINICAL DATA:  Blurred vision, unsteady balance, and weakness today. EXAM: MRI HEAD WITHOUT AND WITH CONTRAST MRA HEAD WITHOUT CONTRAST TECHNIQUE: Multiplanar, multiecho pulse sequences of the brain  and surrounding structures were obtained without and with intravenous contrast. Angiographic images of the head were obtained using MRA technique without contrast. CONTRAST:  33mL MULTIHANCE GADOBENATE DIMEGLUMINE 529 MG/ML IV SOLN COMPARISON:  Head CT 12/14/2015.  Head MRI and MRA 04/05/2009. FINDINGS: MRI HEAD FINDINGS There is no evidence of acute infarct, intracranial hemorrhage, mass, midline shift, or extra-axial fluid collection. The ventricles and sulci are normal. The brain is normal in signal. No abnormal enhancement is identified. Orbits are unremarkable. Paranasal sinuses and mastoid air cells are clear. Major intracranial vascular flow voids are preserved. Bone marrow signal is unremarkable. MRA HEAD FINDINGS The visualized distal vertebral arteries are widely patent and codominant. PICA and SCA origins are patent. Basilar artery is widely patent. There are likely tiny posterior communicating arteries bilaterally. PCAs are unremarkable. Partially visualized tortuous distal cervical ICA on the left. The intracranial ICAs are widely patent. ACAs and MCAs are patent without evidence of major branch occlusion or significant proximal stenosis. No intracranial aneurysm is identified. IMPRESSION: 1. Unremarkable brain MRI without evidence of acute abnormality. 2. Negative head MRA. Electronically Signed   By: Logan Bores M.D.   On: 12/14/2015 18:42   I have personally reviewed and evaluated these images and lab results as part of my medical decision-making.   EKG Interpretation   Date/Time:  Tuesday December 14 2015 13:47:04 EDT Ventricular Rate:  72 PR Interval:  162 QRS Duration: 90 QT Interval:  378 QTC Calculation: 413 R Axis:   -58 Text Interpretation:  Normal sinus rhythm Left anterior fascicular block  Abnormal ECG No significant change since last tracing Confirmed by FLOYD  MD, Quillian Quince [id number]) on 12/14/2015 4:59:13 PM      MDM   Final diagnoses:  None   Patient is afebrile and  non-toxic appearing. His vital signs are stable. Physical exam re-assuring. CT head and labs re-assuring. ?TIA vs. ?pre-syncope event. Consult to neurology for further evaluation of possible TIA.   Consulted Dr. Leonel Ramsay of neurology, appreciated his input. Low suspicion for TIA. Recommended MRI of brain. If normal okay for d/c and outpatient follow up .   MRI and MRA of brain showed no acute abnormalities. ?migraine vs. ?pre-syncope for etiology of symptoms. Discussed results with patient. Patient endorses improvement in disorientation. Politely declines fluids. Will give PO tylenol for headache. Provided outpatient neurology contact information. Provided return precautions. Patient voiced understanding and is agreeable.     Roxanna Mew, PA-C 12/14/15 [place], DO 12/14/15 2038

## 2015-12-14 NOTE — Discharge Instructions (Signed)
Read the information below.   Your labs and imaging were re-assuring. Be sure to drink plenty of fluids. You can take tylenol or ibuprofen for headache relief.  Use the prescribed medication as directed.  Please discuss all new medications with your pharmacist.   Be sure to follow up with your PCP for re-evaluation within one week. I have provided the contact information for Neurology, you can follow up outpatient as needed.  You may return to the Emergency Department at any time for worsening condition or any new symptoms that concern you. Return to ED if you develop fever, neck pain, new neurologic symptoms such as slurred speech, facial droop, weakness, numbness, or loss of consciousness.     Blurred Vision Having blurred vision means that you cannot see things clearly. Your vision may seem fuzzy or out of focus. Blurred vision is a very common symptom of an eye or vision problem. Blurred vision is often a gradual blur that occurs in one eye or both eyes. There are many causes of blurred vision, including cataracts, macular degeneration, and diabetic retinopathy. Blurred vision can be diagnosed based on your symptoms and a physical exam. Tell your health care provider about any other health problems you have, any recent eye injury, and any prior surgeries. You may need to see a health care provider who specializes in eye problems (ophthalmologist). Your treatment depends on what is causing your blurred vision.  HOME CARE INSTRUCTIONS  Tell your health care provider about any changes in your blurred vision.  Do not drive or operate heavy machinery if your vision is blurry.  Keep all follow-up visits as directed by your health care provider. This is important. SEEK MEDICAL CARE IF:  Your symptoms get worse.  You have new symptoms.  You have trouble seeing at night.  You have trouble seeing up close or far away.  You have trouble noticing the difference between colors. SEEK IMMEDIATE  MEDICAL CARE IF:  You have severe eye pain.  You have a severe headache.  You have flashing lights in your field of vision.  You have a sudden change in vision.  You have a sudden loss of vision.  You have vision change after an injury.  You notice drainage coming from your eyes.  You notice a rash around your eyes.   This information is not intended to replace advice given to you by your health care provider. Make sure you discuss any questions you have with your health care provider.   Document Released: 06/15/2003 Document Revised: 10/27/2014 Document Reviewed: 05/06/2014 Elsevier Interactive Patient Education 2016 [employer]  Weakness Weakness is a lack of strength. You may feel weak all over your body or just in one part of your body. Weakness can be serious. In some cases, you may need more medical tests. HOME Amber a well-balanced diet.  Try to exercise every day.  Only take medicines as told by your doctor. GET HELP RIGHT AWAY IF:   You cannot do your normal daily activities.  You cannot walk up and down stairs, or you feel very tired when you do so.  You have shortness of breath or chest pain.  You have trouble moving parts of your body.  You have weakness in only one body part or on only one side of the body.  You have a fever.  You have trouble speaking or swallowing.  You cannot control when you pee (urinate) or poop (bowel movement).  You have  black or bloody throw up (vomit) or poop.  Your weakness gets worse or spreads to other body parts.  You have new aches or pains. MAKE SURE YOU:   Understand these instructions.  Will watch your condition.  Will get help right away if you are not doing well or get worse.   This information is not intended to replace advice given to you by your health care provider. Make sure you discuss any questions you have with your health care provider.   Document Released: 05/25/2008 Document  Revised: 12/12/2011 Document Reviewed: 08/11/2011 Elsevier Interactive Patient Education [employer].

## 2015-12-14 NOTE — ED Notes (Signed)
Pt here for weakness and "feeling out of it" since about lunch time. No obvious weakness, facial droop. Denies numbness. sts both legs are weak. Denies any sickness, chest pain or SOB,

## 2015-12-14 NOTE — ED Notes (Signed)
Patient transported to X-ray 

## 2017-05-01 IMAGING — CT CT HEAD W/O CM
3 of 4 series · 15 of 47 positions shown, 18 images · non-contrast
Comparison: 04/04/2009

CLINICAL DATA: Sudden onset of weakness, blurred vision, unsteady
gait and disorientation today, history TIA, initial encounter

EXAM:
CT HEAD WITHOUT CONTRAST
TECHNIQUE: Contiguous axial images were obtained from the base of the skull
through the vertex without intravenous contrast.

[Series 3: head 2.0 h70h · axial · 0.45mm/px · z∈[-70,+58]mm · 9 of 82 slices shown, 12 images]
[im 9/82  brain]
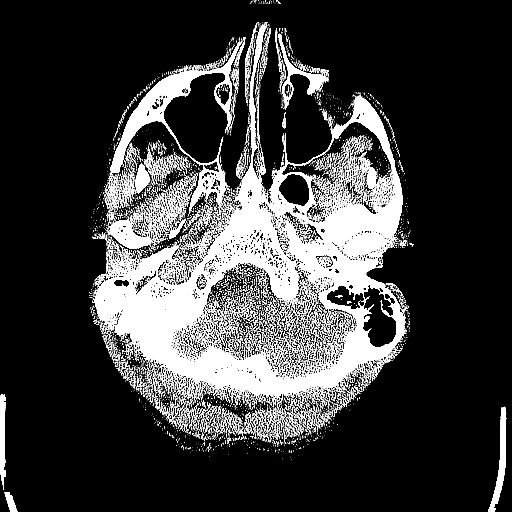
[im 9/82  bone]
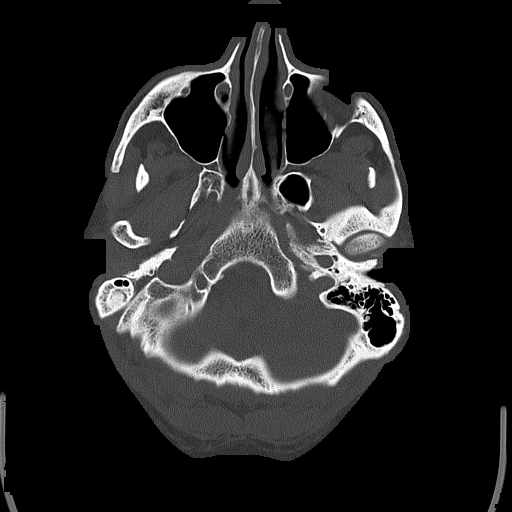
[im 17/82  brain]
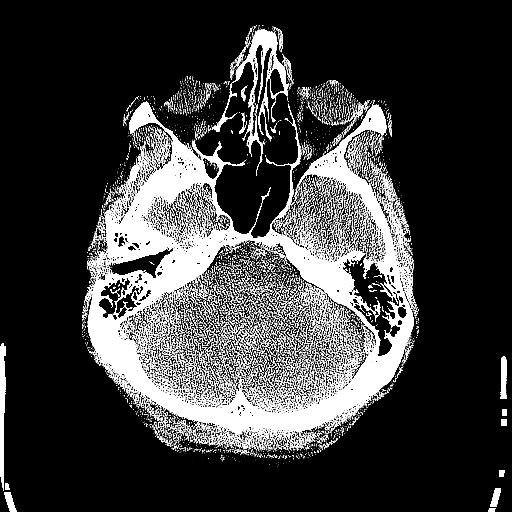
[im 25/82  brain]
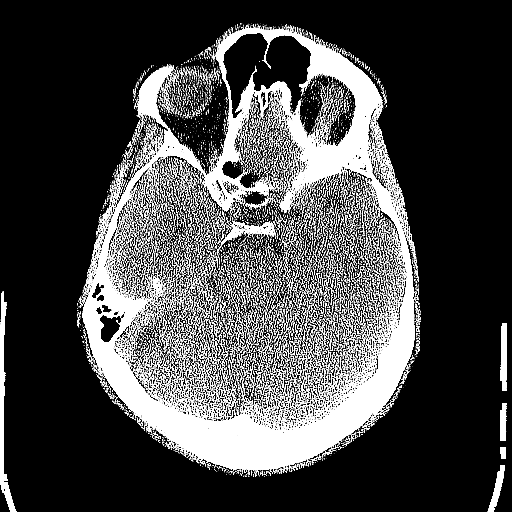
[im 33/82  brain]
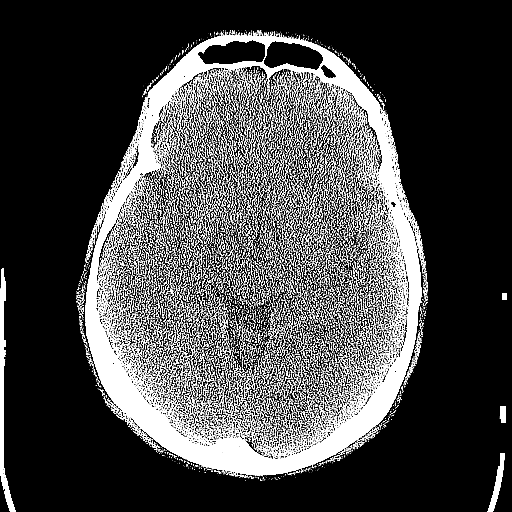
[im 41/82  brain]
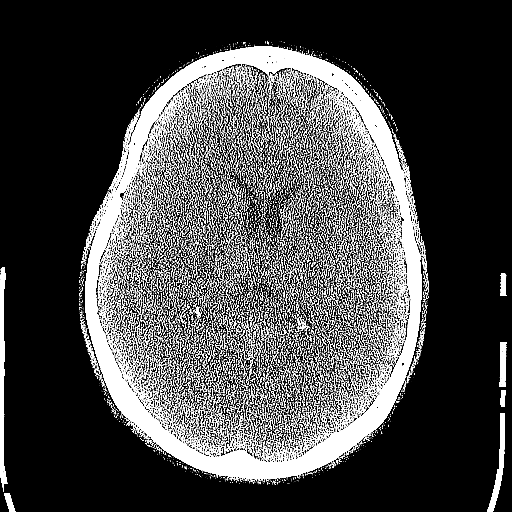
[im 41/82  bone]
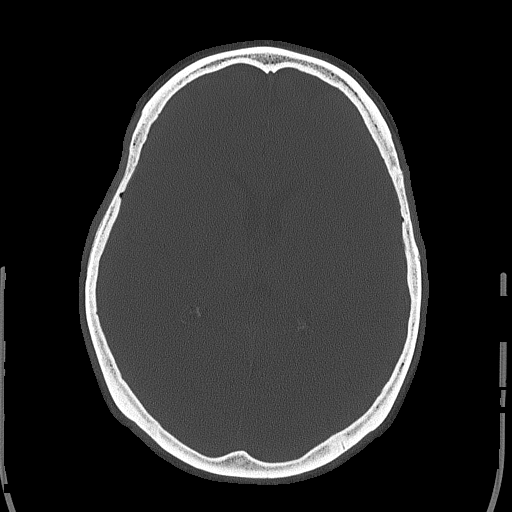
[im 49/82  brain]
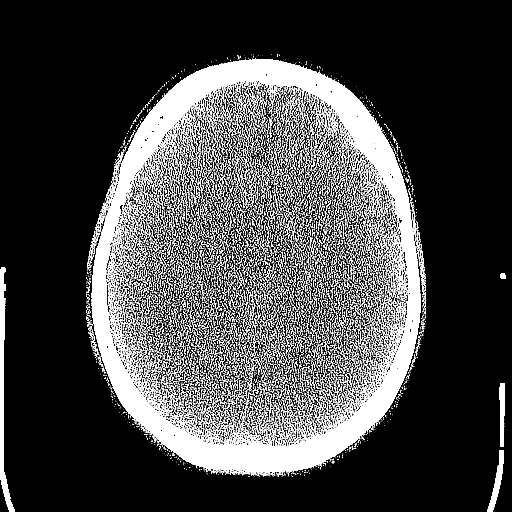
[im 57/82  brain]
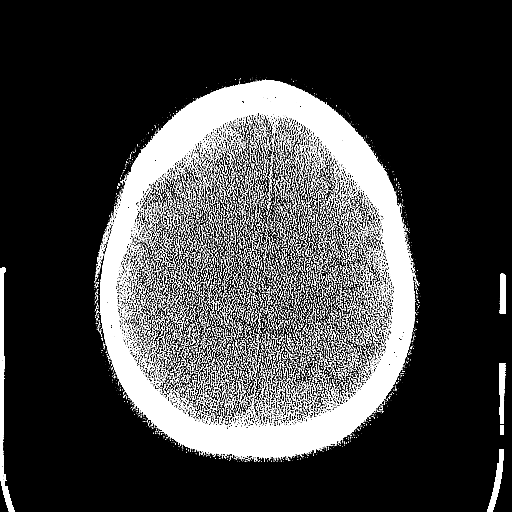
[im 65/82  brain]
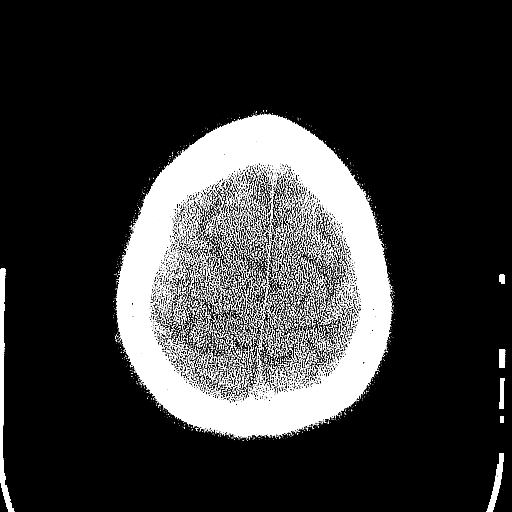
[im 73/82  brain]
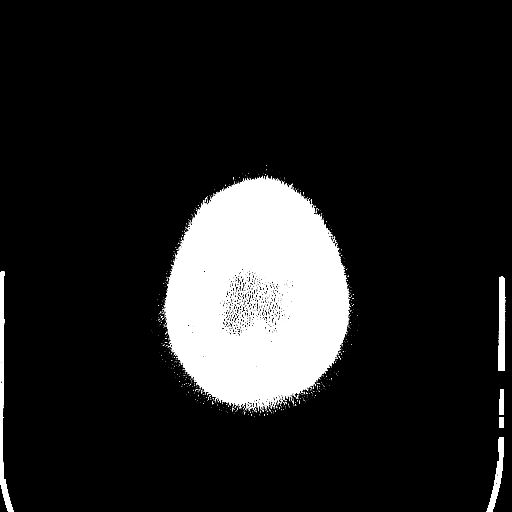
[im 73/82  bone]
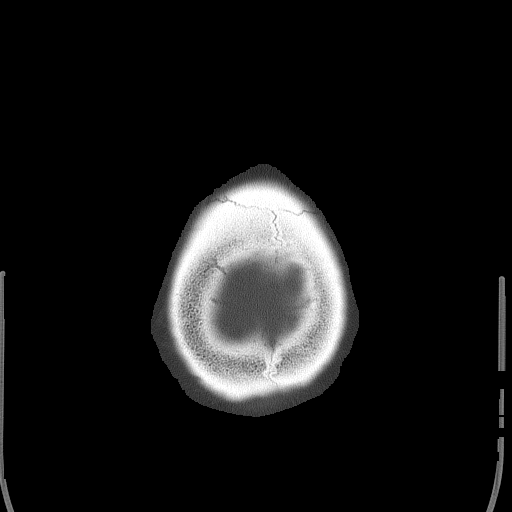

[Series 4: head 3.0 mpr · sagittal · 0.32mm/px · 3 of 60 slices shown (1 of 2)]
[im 20/60  brain]
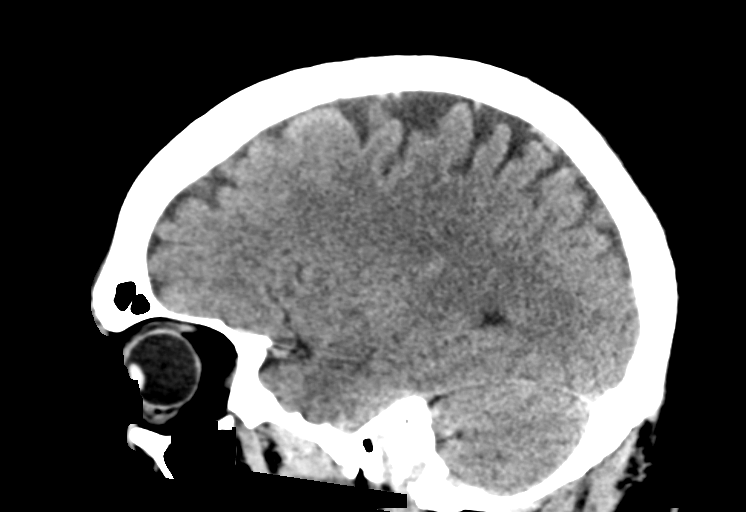
[im 30/60  brain]
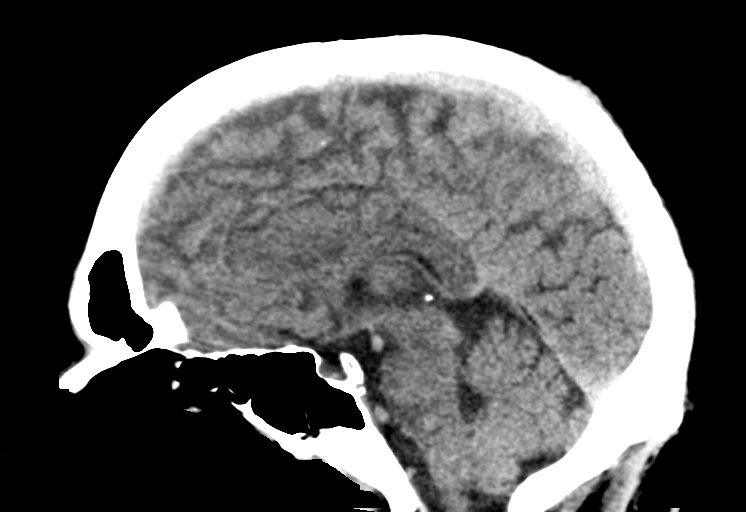
[im 40/60  brain]
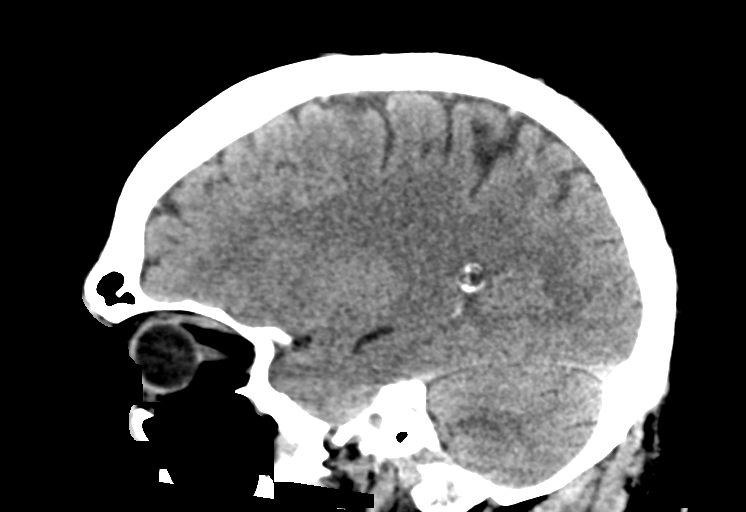

[Series 5: head 3.0 mpr · coronal · 0.33mm/px · 3 of 71 slices shown (2 of 2)]
[im 24/71  brain]
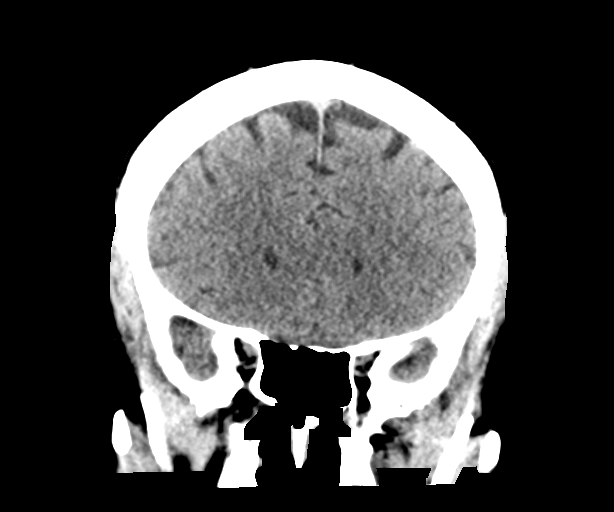
[im 32/71  brain]
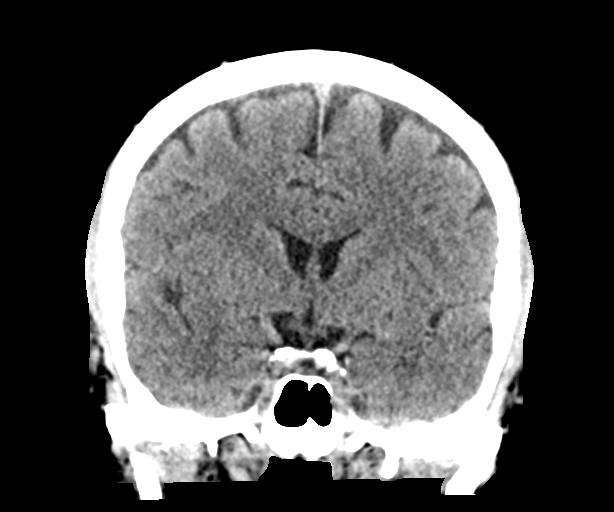
[im 39/71  brain]
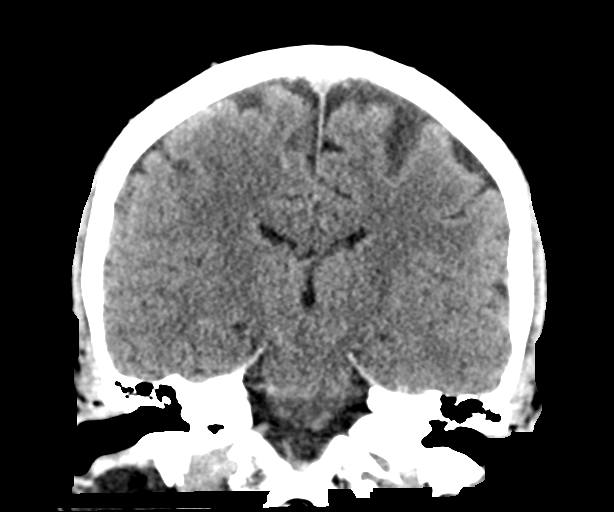

[15 of 47 positions shown; findings below may reference images not displayed]

FINDINGS: Normal ventricular morphology.

No midline shift or mass effect.

Normal appearance of brain parenchyma.

No intracranial hemorrhage, mass lesion or evidence acute
infarction.

No extra-axial fluid collections.

Visualized paranasal sinuses and mastoid air cells clear.

Bones unremarkable.
IMPRESSION: Normal exam.

## 2019-04-03 ENCOUNTER — Encounter: Payer: Self-pay | Admitting: Gastroenterology

## 2019-04-09 ENCOUNTER — Encounter: Payer: Self-pay | Admitting: Gastroenterology

## 2020-07-02 ENCOUNTER — Encounter: Payer: Self-pay | Admitting: Gastroenterology

## 2020-08-12 ENCOUNTER — Ambulatory Visit (AMBULATORY_SURGERY_CENTER): Payer: Self-pay

## 2020-08-12 ENCOUNTER — Other Ambulatory Visit: Payer: Self-pay

## 2020-08-12 VITALS — Ht 72.0 in | Wt 212.0 lb

## 2020-08-12 DIAGNOSIS — Z1211 Encounter for screening for malignant neoplasm of colon: Secondary | ICD-10-CM

## 2020-08-12 MED ORDER — NA SULFATE-K SULFATE-MG SULF 17.5-3.13-1.6 GM/177ML PO SOLN: 1.0000 | Freq: Once | ORAL | 0 refills | Status: AC

## 2020-08-12 NOTE — Progress Notes (Signed)
No allergies to soy or egg Pt is not on blood thinners or diet pills Denies issues with sedation/intubation Denies atrial flutter/fib Denies constipation   Emmi instructions given to pt  Pt is aware of Covid safety and care partner requirements.  

## 2020-08-23 ENCOUNTER — Encounter: Payer: Self-pay | Admitting: Gastroenterology

## 2020-08-26 ENCOUNTER — Encounter: Payer: Self-pay | Admitting: Certified Registered Nurse Anesthetist

## 2020-08-27 ENCOUNTER — Other Ambulatory Visit: Payer: Self-pay

## 2020-08-27 ENCOUNTER — Ambulatory Visit (AMBULATORY_SURGERY_CENTER): Payer: Managed Care, Other (non HMO) | Admitting: Gastroenterology

## 2020-08-27 ENCOUNTER — Encounter: Payer: Self-pay | Admitting: Gastroenterology

## 2020-08-27 VITALS — BP 101/64 | HR 67 | Temp 97.7°F | Resp 19 | Ht 72.0 in | Wt 212.0 lb

## 2020-08-27 DIAGNOSIS — D125 Benign neoplasm of sigmoid colon: Secondary | ICD-10-CM | POA: Diagnosis not present

## 2020-08-27 DIAGNOSIS — D120 Benign neoplasm of cecum: Secondary | ICD-10-CM | POA: Diagnosis not present

## 2020-08-27 DIAGNOSIS — Z1211 Encounter for screening for malignant neoplasm of colon: Secondary | ICD-10-CM | POA: Diagnosis not present

## 2020-08-27 MED ORDER — SODIUM CHLORIDE 0.9 % IV SOLN: 500.0000 mL | Freq: Once | INTRAVENOUS | Status: DC

## 2020-08-27 NOTE — Patient Instructions (Signed)
Impression/Recommendations: ? ?Polyp and diverticulosis handouts given to patient. ? ?Resume previous diet. ?Continue present medications. ?Await pathology results. ? ?Repeat colonoscopy recommended for surveillance.  Date to be determined after pathology results reviewed. ? ?YOU HAD AN ENDOSCOPIC PROCEDURE TODAY AT THE [facility]:   Refer to the procedure report that was given to you for any specific questions about what was found during the examination.  If the procedure report does not answer your questions, please call your gastroenterologist to clarify.  If you requested that your care partner not be given the details of your procedure findings, then the procedure report has been included in a sealed envelope for you to review at your convenience later. ? ?YOU SHOULD EXPECT: Some feelings of bloating in the abdomen. Passage of more gas than usual.  Walking can help get rid of the air that was put into your GI tract during the procedure and reduce the bloating. If you had a lower endoscopy (such as a colonoscopy or flexible sigmoidoscopy) you may notice spotting of blood in your stool or on the toilet paper. If you underwent a bowel prep for your procedure, you may not have a normal bowel movement for a few days. ? ?Please Note:  You might notice some irritation and congestion in your nose or some drainage.  This is from the oxygen used during your procedure.  There is no need for concern and it should clear up in a day or so. ? ?SYMPTOMS TO REPORT IMMEDIATELY: ? ?Following lower endoscopy (colonoscopy or flexible sigmoidoscopy): ? Excessive amounts of blood in the stool ? Significant tenderness or worsening of abdominal pains ? Swelling of the abdomen that is new, acute ? Fever of 100?F or higher ? ?For urgent or emergent issues, a gastroenterologist can be reached at any hour by calling [phone]. ?Do not use MyChart messaging for urgent concerns.  ? ? ?DIET:  We do recommend a small meal at  first, but then you may proceed to your regular diet.  Drink plenty of fluids but you should avoid alcoholic beverages for 24 hours. ? ?ACTIVITY:  You should plan to take it easy for the rest of today and you should NOT DRIVE or use heavy machinery until tomorrow (because of the sedation medicines used during the test).   ? ?FOLLOW UP: ?Our staff will call the number listed on your records 48-72 hours following your procedure to check on you and address any questions or concerns that you may have regarding the information given to you following your procedure. If we do not reach you, we will leave a message.  We will attempt to reach you two times.  During this call, we will ask if you have developed any symptoms of COVID 19. If you develop any symptoms (ie: fever, flu-like symptoms, shortness of breath, cough etc.) before then, please call [phone].  If you test positive for Covid 19 in the 2 weeks post procedure, please call and report this information to us.   ? ?If any biopsies were taken you will be contacted by phone or by letter within the next 1-3 weeks.  Please call us at [phone] if you have not heard about the biopsies in 3 weeks.  ? ? ?SIGNATURES/CONFIDENTIALITY: ?You and/or your care partner have signed paperwork which will be entered into your electronic medical record.  These signatures attest to the fact that that the information above on your After Visit Summary has been reviewed and is understood.    Full responsibility of the confidentiality of this discharge information lies with you and/or your care-partner.  ?

## 2020-08-27 NOTE — Progress Notes (Signed)
Pt's states no medical or surgical changes since previsit or office visit.  CW - vitals 

## 2020-08-27 NOTE — Op Note (Signed)
[facility] Patient Name: Chris Mendez Procedure Date: 08/27/2020 7:59 AM MRN: [id number] Endoscopist: Mallie Mussel L. Loletha Carrow , MD Age: 60 Referring MD:  Date of Birth: Oct 11, 1960 Gender: Male Account #: [id number] Procedure:                Colonoscopy Indications:              Screening for colorectal malignant neoplasm (no                            polyps last colonoscopy 04/2009) Medicines:                Monitored Anesthesia Care Procedure:                Pre-Anesthesia Assessment:                           - Prior to the procedure, a History and Physical                            was performed, and patient medications and                            allergies were reviewed. The patient's tolerance of                            previous anesthesia was also reviewed. The risks                            and benefits of the procedure and the sedation                            options and risks were discussed with the patient.                            All questions were answered, and informed consent                            was obtained. Prior Anticoagulants: The patient has                            taken no previous anticoagulant or antiplatelet                            agents. ASA Grade Assessment: III - A patient with                            severe systemic disease. After reviewing the risks                            and benefits, the patient was deemed in                            satisfactory condition to undergo the procedure.  After obtaining informed consent, the colonoscope                            was passed under direct vision. Throughout the                            procedure, the patient's blood pressure, pulse, and                            oxygen saturations were monitored continuously. The                            Olympus [id number]) [id number] was introduced                            through the anus and  advanced to the the cecum,                            identified by appendiceal orifice and ileocecal                            valve. The colonoscopy was performed without                            difficulty. The patient tolerated the procedure                            well. The quality of the bowel preparation was                            good. The ileocecal valve, appendiceal orifice, and                            rectum were photographed. Scope In: 8:03:33 AM Scope Out: 8:18:38 AM Scope Withdrawal Time: 0 hours 12 minutes 47 seconds  Total Procedure Duration: 0 hours 15 minutes 5 seconds  Findings:                 The perianal and digital rectal examinations were                            normal.                           Two sessile polyps were found in the sigmoid colon                            and cecum. The polyps were diminutive in size.                            These polyps were removed with a cold snare.                            Resection and retrieval were complete.  A few small-mouthed diverticula were found in the                            left colon.                           The exam was otherwise without abnormality on                            direct and retroflexion views. Complications:            No immediate complications. Estimated Blood Loss:     Estimated blood loss was minimal. Impression:               - Two diminutive polyps in the sigmoid colon and in                            the cecum, removed with a cold snare. Resected and                            retrieved.                           - Diverticulosis in the left colon.                           - The examination was otherwise normal on direct                            and retroflexion views. Recommendation:           - Patient has a contact number available for                            emergencies. The signs and symptoms of potential                             delayed complications were discussed with the                            patient. Return to normal activities tomorrow.                            Written discharge instructions were provided to the                            patient.                           - Resume previous diet.                           - Continue present medications.                           - Await pathology results.                           -  Repeat colonoscopy is recommended for                            surveillance. The colonoscopy date will be                            determined after pathology results from today's                            exam become available for review. Preciosa Bundrick L. Loletha Carrow, MD 08/27/2020 8:22:43 AM This report has been signed electronically.

## 2020-08-27 NOTE — Progress Notes (Signed)
Report given to PACU, vss 

## 2020-08-31 ENCOUNTER — Telehealth: Payer: Self-pay

## 2020-08-31 NOTE — Telephone Encounter (Signed)
  Follow up Call-  Call back number 08/27/2020  Post procedure Call Back phone  # [phone]  Permission to leave phone message Yes  Some recent data might be hidden     Patient questions:  Do you have a fever, pain , or abdominal swelling? No. Pain Score  0 *  Have you tolerated food without any problems? Yes.    Have you been able to return to your normal activities? Yes.    Do you have any questions about your discharge instructions: Diet   No. Medications  No. Follow up visit  No.  Do you have questions or concerns about your Care? No.  Actions: * If pain score is 4 or above: No action needed, pain <4.  1. Have you developed a fever since your procedure? no  2.   Have you had an respiratory symptoms (SOB or cough) since your procedure? no  3.   Have you tested positive for COVID 19 since your procedure no  4.   Have you had any family members/close contacts diagnosed with the COVID 19 since your procedure?  no   If yes to any of these questions please route to Joylene John, RN and Joella Prince, RN

## 2020-08-31 NOTE — Telephone Encounter (Signed)
First post procedure follow up call, no answer 

## 2020-09-03 ENCOUNTER — Encounter: Payer: Self-pay | Admitting: Gastroenterology

## 2021-07-12 ENCOUNTER — Encounter: Payer: Self-pay | Admitting: Family Medicine

## 2021-07-12 NOTE — Telephone Encounter (Signed)
Please advise. Patient has not been seen in 5 years.

## 2021-08-24 ENCOUNTER — Ambulatory Visit (INDEPENDENT_AMBULATORY_CARE_PROVIDER_SITE_OTHER): Payer: Managed Care, Other (non HMO) | Admitting: Family Medicine

## 2021-08-24 ENCOUNTER — Encounter: Payer: Self-pay | Admitting: Family Medicine

## 2021-08-24 VITALS — BP 122/76 | HR 77 | Temp 98.4°F | Resp 16 | Ht 72.0 in | Wt 212.3 lb

## 2021-08-24 DIAGNOSIS — Z0000 Encounter for general adult medical examination without abnormal findings: Secondary | ICD-10-CM

## 2021-08-24 NOTE — Patient Instructions (Signed)
Would like to request labs from [facility].   ?

## 2021-08-24 NOTE — Progress Notes (Signed)
? ?New Patient Office Visit ? ?Subjective:  ?Patient ID: Chris Mendez, male    DOB: 03-11-1961  Age: 61 y.o. MRN: [id number] ? ?CC: No chief complaint on file. ? ? ?HPI ?Chris Mendez presents to reestablish and for physical exam.  He basically has been getting medical care including comprehensive physicals through his work program up until last March when he retired.  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  He had quite extensive evaluations including things like carotid Dopplers, stress test, chest x-ray, etc. ?Patient states he had some recent labs through [facility].  He will try to get those over to [place]. ? ?Past medical history reviewed.  Remote history of TIA several years ago.  He has history of dyslipidemia treated with Lipitor 10 mg daily.  Takes aspirin 81 mg daily. ? ?He brings in information that he had coronary calcium score of 123 which was 69th percentile for age and gender this was over a year ago.  He reports previous stress testing unremarkable. ? ?Health maintenance ? ?-Flu vaccine given in October ?-Previous hepatitis C screen negative ?-Last colonoscopy 3/22 with recommended 7-year follow-up ?-Tetanus due 3/24 ?-Has had Shingrix vaccine. ? ?Social history retired in March.  He has 2 sons.  No grandchildren.  No history of smoking.  Only occasional alcohol use.  He is married.  His wife has cancer and has been battling that for some time. ? ?Family history mother had reportedly thalamus cancer and history of hypertension.  His father had CABG age 18.  Both parents are alive in their 79s.  Has a brother with hypertension.  Sister generally healthy. ? ?Past Medical History:  ?Diagnosis Date  ? DYSLIPIDEMIA 04/21/2009  ? ELEVATED BLOOD PRESSURE 04/21/2009  ? GERD (gastroesophageal reflux disease)   ? HEMORRHOIDS, EXTERNAL 02/09/2010  ? Stroke [facility])   ? TIA 2011 no residual or repeat episodes  ? TIA 04/21/2009  ? ? ?Past Surgical History:  ?Procedure  Laterality Date  ? HERNIA REPAIR    ? TONSILLECTOMY    ? ? ?Family History  ?Problem Relation Age of Onset  ? Hypertension Mother   ? Cancer Mother   ?     thalmus  ? Hypertension Father   ? Hyperlipidemia Father   ? Heart disease Father 2  ?     cabg  ? Hypertension Brother   ? Cancer Maternal Aunt   ?     breast  ? Colon cancer Maternal Aunt 80  ? Cancer Maternal Grandmother   ?     breast  ? Esophageal cancer Paternal Grandfather 78  ? Colon polyps Neg Hx   ? Stomach cancer Neg Hx   ? Rectal cancer Neg Hx   ? ? ?Social History  ? ?Socioeconomic History  ? Marital status: Married  ?  Spouse name: Not on file  ? Number of children: Not on file  ? Years of education: Not on file  ? Highest education level: Not on file  ?Occupational History  ? Not on file  ?Tobacco Use  ? Smoking status: Never  ? Smokeless tobacco: Never  ?Vaping Use  ? Vaping Use: Never used  ?Substance and Sexual Activity  ? Alcohol use: Yes  ? Drug use: No  ? Sexual activity: Not on file  ?Other Topics Concern  ? Not on file  ?Social History Narrative  ? Not on file  ? ?Social Determinants of Health  ? ?Financial Resource Strain: Not  on file  ?Food Insecurity: Not on file  ?Transportation Needs: Not on file  ?Physical Activity: Not on file  ?Stress: Not on file  ?Social Connections: Not on file  ?Intimate Partner Violence: Not on file  ? ? ?ROS ?Review of Systems  ?Constitutional:  Negative for activity change, appetite change, fatigue and fever.  ?HENT:  Negative for congestion, ear pain and trouble swallowing.   ?Eyes:  Negative for pain and visual disturbance.  ?Respiratory:  Negative for cough, shortness of breath and wheezing.   ?Cardiovascular:  Negative for chest pain and palpitations.  ?Gastrointestinal:  Negative for abdominal distention, abdominal pain, blood in stool, constipation, diarrhea, nausea, rectal pain and vomiting.  ?Endocrine: Negative for polydipsia and polyuria.  ?Genitourinary:  Negative for dysuria, hematuria and  testicular pain.  ?Musculoskeletal:  Negative for arthralgias and joint swelling.  ?Skin:  Negative for rash.  ?Neurological:  Negative for dizziness, syncope and headaches.  ?Hematological:  Negative for adenopathy.  ?Psychiatric/Behavioral:  Negative for confusion and dysphoric mood.   ? ?Objective:  ? ?Today's Vitals: BP 122/76   Pulse 77   Temp 98.4 ?F (36.9 ?C)   Resp 16   Ht 6' (1.829 m)   Wt 212 lb 4.8 oz (96.3 kg)   SpO2 96%   BMI 28.79 kg/m?  ? ?Physical Exam ?Constitutional:   ?   General: He is not in acute distress. ?   Appearance: He is well-developed.  ?HENT:  ?   Head: Normocephalic and atraumatic.  ?   Right Ear: External ear normal.  ?   Left Ear: External ear normal.  ?Eyes:  ?   Conjunctiva/sclera: Conjunctivae normal.  ?   Pupils: Pupils are equal, round, and reactive to light.  ?Neck:  ?   Thyroid: No thyromegaly.  ?Cardiovascular:  ?   Rate and Rhythm: Normal rate and regular rhythm.  ?   Heart sounds: Normal heart sounds. No murmur heard. ?Pulmonary:  ?   Effort: No respiratory distress.  ?   Breath sounds: No wheezing or rales.  ?Abdominal:  ?   General: Bowel sounds are normal. There is no distension.  ?   Palpations: Abdomen is soft. There is no mass.  ?   Tenderness: There is no abdominal tenderness. There is no guarding or rebound.  ?Musculoskeletal:  ?   Cervical back: Normal range of motion and neck supple.  ?   Right lower leg: No edema.  ?   Left lower leg: No edema.  ?Lymphadenopathy:  ?   Cervical: No cervical adenopathy.  ?Skin: ?   Findings: No rash.  ?Neurological:  ?   Mental Status: He is alert and oriented to person, place, and time.  ?   Cranial Nerves: No cranial nerve deficit.  ? ? ?Assessment & Plan:  ? ?Physical exam.  Patient is reestablishing care.  Generally fairly healthy.  He has history of hyperlipidemia treated with atorvastatin.  Recent coronary calcium score of 123.  Reports recent cardiac stress test unremarkable. ? ?-Immunizations up-to-date ?-Continue  annual flu vaccine ?-We recommend that he signed request for record release so we can get recent labs from [facility]. ?-He has had some myalgias which she thinks may be related to Lipitor.  We did discuss possible change to rosuvastatin but he wishes to wait at this time. ? ?Outpatient Encounter Medications as of 08/24/2021  ?Medication Sig  ? aspirin 81 MG EC tablet   ? atorvastatin (LIPITOR) 10 MG tablet atorvastatin 10 mg  tablet  ? Multiple Vitamin (MULTIVITAMIN) tablet Take by mouth.  ? sildenafil (REVATIO) 20 MG tablet sildenafil (pulmonary hypertension) 20 mg tablet ? TAKE 2 TO 5 TABLETS BY MOUTH 1 HOUR BEFORE INTERCOURSE AS NEEDED NO MORE THAN 5 TABLETS IN 24 HOURS.  ? [DISCONTINUED] MAGNESIUM PO Take 1 tablet by mouth daily. (Patient not taking: No sig reported)  ? [DISCONTINUED] Probiotic Product (PROBIOTIC PO) Take 1 tablet by mouth daily. (Patient not taking: No sig reported)  ? [DISCONTINUED] Pyridoxine HCl (VITAMIN B-6 PO) Take 1 tablet by mouth daily. (Patient not taking: No sig reported)  ? ?No facility-administered encounter medications on file as of 08/24/2021.  ? ? ?Follow-up: No follow-ups on file.  ? ?Carolann Littler, MD ? ?

## 2021-12-28 ENCOUNTER — Encounter: Payer: Self-pay | Admitting: Family Medicine

## 2021-12-28 ENCOUNTER — Ambulatory Visit (INDEPENDENT_AMBULATORY_CARE_PROVIDER_SITE_OTHER): Payer: Managed Care, Other (non HMO) | Admitting: Family Medicine

## 2021-12-28 VITALS — BP 110/82 | HR 75 | Temp 98.1°F | Ht 72.0 in | Wt 220.6 lb

## 2021-12-28 DIAGNOSIS — W57XXXA Bitten or stung by nonvenomous insect and other nonvenomous arthropods, initial encounter: Secondary | ICD-10-CM

## 2021-12-28 DIAGNOSIS — S30861A Insect bite (nonvenomous) of abdominal wall, initial encounter: Secondary | ICD-10-CM

## 2021-12-28 NOTE — Progress Notes (Signed)
   Established Patient Office Visit  Subjective   Patient ID: Chris Mendez, male    DOB: July 14, 1960  Age: 61 y.o. MRN: [id number]  Chief Complaint  Patient presents with   Tick Removal    Patient states he removed a tick from the lower abdomen x4 days ago, complains of redness and itching around the area    HPI   Recent tick bite below umbilicus.  He noticed this Saturday.  Was able to fully remove tick.  This was a [employer] tick.  He brings in a picture of this on his phone.  He applied some Neosporin.  Has had some localized pruritus.  No fevers or chills.  No headache.  No joint pains.  Past Medical History:  Diagnosis Date   DYSLIPIDEMIA 04/21/2009   ELEVATED BLOOD PRESSURE 04/21/2009   GERD (gastroesophageal reflux disease)    HEMORRHOIDS, EXTERNAL 02/09/2010   Stroke [facility])    TIA 2011 no residual or repeat episodes   TIA 04/21/2009   Past Surgical History:  Procedure Laterality Date   HERNIA REPAIR     TONSILLECTOMY      reports that he has never smoked. He has never used smokeless tobacco. He reports current alcohol use. He reports that he does not use drugs. family history includes Cancer in his maternal aunt, maternal grandmother, and mother; Colon cancer (age of onset: 88) in his maternal aunt; Esophageal cancer (age of onset: 67) in his paternal grandfather; Heart disease (age of onset: 67) in his father; Hyperlipidemia in his father; Hypertension in his brother, father, and mother. No Known Allergies  Review of Systems  Constitutional:  Negative for chills and fever.  Respiratory:  Negative for shortness of breath.   Cardiovascular:  Negative for chest pain.  Gastrointestinal:  Negative for nausea and vomiting.  Neurological:  Negative for headaches.      Objective:     BP 110/82 (BP Location: Left Arm, Patient Position: Sitting, Cuff Size: Normal)   Pulse 75   Temp 98.1 F (36.7 C) (Oral)   Ht 6' (1.829 m)   Wt 220 lb 9.6 oz (100.1 kg)   SpO2 98%    BMI 29.92 kg/m    Physical Exam Vitals reviewed.  Constitutional:      Appearance: Normal appearance.  Cardiovascular:     Rate and Rhythm: Normal rate and regular rhythm.  Skin:    Comments: Very small eschar at site of recent bite lower midline abdomen.  He has small area of surrounding erythema which looks almost more urticarial.  No other rash noted.  Neurological:     Mental Status: He is alert.      No results found for any visits on 12/28/21.    The 10-year ASCVD risk score (Arnett DK, et al., 2019) is: 6.8%    Assessment & Plan:   Problem List Items Addressed This Visit   None Visit Diagnoses     Tick bite of abdomen, initial encounter    -  Primary     Recent tick bite with [place] tick.  Localized allergic reaction.  Possibly exacerbated by Neosporin.  Leave off Neosporin.  Consider over-the-counter topical hydrocortisone cream.  We discussed tick fever in some detail.  Watch out for any generalized urticaria-or other signs and symptoms of angioedema.  We discussed alpha gal sensitization and things to look for down the road.  No follow-ups on file.    Carolann Littler, MD

## 2023-01-24 ENCOUNTER — Encounter (INDEPENDENT_AMBULATORY_CARE_PROVIDER_SITE_OTHER): Payer: Self-pay

## 2023-02-12 ENCOUNTER — Ambulatory Visit (INDEPENDENT_AMBULATORY_CARE_PROVIDER_SITE_OTHER): Payer: Commercial Managed Care - HMO | Admitting: Family Medicine

## 2023-02-12 ENCOUNTER — Encounter: Payer: Self-pay | Admitting: Family Medicine

## 2023-02-12 VITALS — BP 126/76 | HR 78 | Temp 97.9°F | Ht 72.44 in | Wt 218.8 lb

## 2023-02-12 DIAGNOSIS — Z0000 Encounter for general adult medical examination without abnormal findings: Secondary | ICD-10-CM

## 2023-02-12 DIAGNOSIS — E559 Vitamin D deficiency, unspecified: Secondary | ICD-10-CM

## 2023-02-12 DIAGNOSIS — E538 Deficiency of other specified B group vitamins: Secondary | ICD-10-CM | POA: Diagnosis not present

## 2023-02-12 DIAGNOSIS — Z125 Encounter for screening for malignant neoplasm of prostate: Secondary | ICD-10-CM | POA: Diagnosis not present

## 2023-02-12 DIAGNOSIS — Z13220 Encounter for screening for lipoid disorders: Secondary | ICD-10-CM | POA: Diagnosis not present

## 2023-02-12 LAB — CBC WITH DIFFERENTIAL/PLATELET
Basophils Absolute: 0.1 10*3/uL (ref 0.0–0.1)
Basophils Relative: 0.8 % (ref 0.0–3.0)
Eosinophils Absolute: 0.1 10*3/uL (ref 0.0–0.7)
Eosinophils Relative: 1.4 % (ref 0.0–5.0)
HCT: 45.8 % (ref 39.0–52.0)
Hemoglobin: 15.4 g/dL (ref 13.0–17.0)
Lymphocytes Relative: 15 % (ref 12.0–46.0)
Lymphs Abs: 1.5 10*3/uL (ref 0.7–4.0)
MCHC: 33.5 g/dL (ref 30.0–36.0)
MCV: 89.7 fl (ref 78.0–100.0)
Monocytes Absolute: 0.9 10*3/uL (ref 0.1–1.0)
Monocytes Relative: 8.9 % (ref 3.0–12.0)
Neutro Abs: 7.1 10*3/uL (ref 1.4–7.7)
Neutrophils Relative %: 73.9 % (ref 43.0–77.0)
Platelets: 317 10*3/uL (ref 150.0–400.0)
RBC: 5.11 Mil/uL (ref 4.22–5.81)
RDW: 12.6 % (ref 11.5–15.5)
WBC: 9.7 10*3/uL (ref 4.0–10.5)

## 2023-02-12 LAB — HEPATIC FUNCTION PANEL
ALT: 26 U/L (ref 0–53)
AST: 20 U/L (ref 0–37)
Albumin: 4.3 g/dL (ref 3.5–5.2)
Alkaline Phosphatase: 65 U/L (ref 39–117)
Bilirubin, Direct: 0.2 mg/dL (ref 0.0–0.3)
Total Bilirubin: 0.9 mg/dL (ref 0.2–1.2)
Total Protein: 7.2 g/dL (ref 6.0–8.3)

## 2023-02-12 LAB — LIPID PANEL
Cholesterol: 162 mg/dL (ref 0–200)
HDL: 38 mg/dL — ABNORMAL LOW (ref >39.00)
NonHDL: 124.34
Total CHOL/HDL Ratio: 4
Triglycerides: 208 mg/dL — ABNORMAL HIGH (ref 0.0–149.0)
VLDL: 41.6 mg/dL — ABNORMAL HIGH (ref 0.0–40.0)

## 2023-02-12 LAB — LDL CHOLESTEROL, DIRECT: Direct LDL: 110 mg/dL

## 2023-02-12 LAB — VITAMIN D 25 HYDROXY (VIT D DEFICIENCY, FRACTURES): VITD: 18.09 ng/mL — ABNORMAL LOW (ref 30.00–100.00)

## 2023-02-12 LAB — BASIC METABOLIC PANEL
BUN: 15 mg/dL (ref 6–23)
CO2: 25 mEq/L (ref 19–32)
Calcium: 9.8 mg/dL (ref 8.4–10.5)
Chloride: 106 mEq/L (ref 96–112)
Creatinine, Ser: 0.85 mg/dL (ref 0.40–1.50)
GFR: 92.17 mL/min (ref >60.00)
Glucose, Bld: 104 mg/dL — ABNORMAL HIGH (ref 70–99)
Potassium: 4.4 mEq/L (ref 3.5–5.1)
Sodium: 142 mEq/L (ref 135–145)

## 2023-02-12 LAB — VITAMIN B12: Vitamin B-12: 243 pg/mL (ref 211–911)

## 2023-02-12 LAB — HEMOGLOBIN A1C: Hgb A1c MFr Bld: 5.7 % (ref 4.6–6.5)

## 2023-02-12 LAB — PSA: PSA: 1.83 ng/mL (ref 0.10–4.00)

## 2023-02-12 MED ORDER — SILDENAFIL CITRATE 20 MG PO TABS: ORAL_TABLET | ORAL | 2 refills | Status: DC

## 2023-02-12 NOTE — Progress Notes (Signed)
Established Patient Office Visit  Subjective   Patient ID: Chris Mendez, male    DOB: 1960/11/28  Age: 62 y.o. MRN: [id number]  Chief Complaint  Patient presents with   Annual Exam    HPI   Chris Mendez is seen for physical exam.  He has remote history of TIA and history of dyslipidemia.  Previous coronary calcium score of 123.  Generally doing well.  Retired couple years ago.  Exercising fairly regular with walking about 4 miles most days of the week.  Has not done a lot of resistance training recently.  Hopes to introduce more that this year.  Has had some frequent coughing after eating.  He feels like omeprazole helps.  Occasional reflux symptoms at night but not consistently.  No dysphagia.  He also relates occasional fleeting lower abdominal sharp pains rating from the side around anteriorly.  No low back pain.  No rash.  Symptoms are infrequent.  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  Takes over-the-counter multivitamin with D and B12.  Health maintenance reviewed:  Health Maintenance  Topic Date Due   HIV Screening  Never done   COVID-19 Vaccine (6 - 2023-24 season) 05/12/2022   INFLUENZA VACCINE  01/25/2023   Colonoscopy  08/28/2027   DTaP/Tdap/Td (5 - Td or Tdap) 03/22/2032   Hepatitis C Screening  Completed   Zoster Vaccines- Shingrix  Completed   HPV VACCINES  Aged Out   Family history-mother had history of thyroid cancer and not thalamus cancer as previously dictated on note last year.  She also had history of hypertension.  She apparently died earlier this year at age 82 complications of kidney failure and congestive heart failure.  His father still alive age 32.  History of bypass around age 27.  He has a brother with hypertension.  Sister who is well.  Social history-retired from [employer] in March 2022.  Has 2 sons.  Never smoked.  Only occasional alcohol use.  Past Medical History:   Diagnosis Date   DYSLIPIDEMIA 04/21/2009   ELEVATED BLOOD PRESSURE 04/21/2009   GERD (gastroesophageal reflux disease)    HEMORRHOIDS, EXTERNAL 02/09/2010   Stroke [facility])    TIA 2011 no residual or repeat episodes   TIA 04/21/2009   Past Surgical History:  Procedure Laterality Date   HERNIA REPAIR     TONSILLECTOMY      reports that he has never smoked. He has never used smokeless tobacco. He reports current alcohol use. He reports that he does not use drugs. family history includes Cancer in his maternal aunt, maternal grandmother, and mother; Colon cancer (age of onset: 12) in his maternal aunt; Esophageal cancer (age of onset: 74) in his paternal grandfather; Heart disease (age of onset: 10) in his father; Hyperlipidemia in his father; Hypertension in his brother, father, and mother. No Known Allergies   Review of Systems  Constitutional:  Negative for chills, fever, malaise/fatigue and weight loss.  HENT:  Negative for hearing loss.   Eyes:  Negative for blurred vision and double vision.  Respiratory:  Negative for cough and shortness of breath.   Cardiovascular:  Negative for chest pain, palpitations and leg swelling.  Gastrointestinal:  Negative for abdominal pain, blood in stool, constipation and diarrhea.  Genitourinary:  Negative for dysuria.  Skin:  Negative for rash.  Neurological:  Negative for dizziness, speech change, seizures, loss of consciousness and  headaches.  Psychiatric/Behavioral:  Negative for depression.       Objective:     BP 126/76 (BP Location: Left Arm, Patient Position: Sitting, Cuff Size: Normal)   Pulse 78   Temp 97.9 F (36.6 C) (Oral)   Ht 6' 0.44" (1.84 m)   Wt 218 lb 12.8 oz (99.2 kg)   SpO2 96%   BMI 29.31 kg/m  BP Readings from Last 3 Encounters:  02/12/23 126/76  12/28/21 110/82  08/24/21 122/76   Wt Readings from Last 3 Encounters:  02/12/23 218 lb 12.8 oz (99.2 kg)  12/28/21 220 lb 9.6 oz (100.1 kg)  08/24/21 212 lb 4.8 oz  (96.3 kg)      Physical Exam Vitals reviewed.  Constitutional:      General: He is not in acute distress.    Appearance: He is well-developed.  HENT:     Head: Normocephalic and atraumatic.     Ears:     Comments: Cerumen left canal.  Right canal is clear. Eyes:     Conjunctiva/sclera: Conjunctivae normal.     Pupils: Pupils are equal, round, and reactive to light.  Neck:     Thyroid: No thyromegaly.  Cardiovascular:     Rate and Rhythm: Normal rate and regular rhythm.     Heart sounds: Normal heart sounds. No murmur heard. Pulmonary:     Effort: No respiratory distress.     Breath sounds: No wheezing or rales.  Abdominal:     General: Bowel sounds are normal. There is no distension.     Palpations: Abdomen is soft. There is no mass.     Tenderness: There is no abdominal tenderness. There is no guarding or rebound.  Musculoskeletal:     Cervical back: Normal range of motion and neck supple.     Right lower leg: No edema.     Left lower leg: No edema.  Lymphadenopathy:     Cervical: No cervical adenopathy.  Skin:    Findings: No rash.  Neurological:     Mental Status: He is alert and oriented to person, place, and time.     Cranial Nerves: No cranial nerve deficit.     Deep Tendon Reflexes: Reflexes normal.      No results found for any visits on 02/12/23.    The ASCVD Risk score (Arnett DK, et al., 2019) failed to calculate for the following reasons:   The patient has a prior MI or stroke diagnosis    Assessment & Plan:   Problem List Items Addressed This Visit   None Visit Diagnoses     Physical exam    -  Primary   Relevant Medications   sildenafil (REVATIO) 20 MG tablet   Other Relevant Orders   Basic metabolic panel   Lipid panel   CBC with Differential/Platelet   Hepatic function panel   PSA   Hemoglobin A1c   B12 deficiency       Relevant Orders   Vitamin B12   Vitamin D deficiency       Relevant Orders   VITAMIN D 25 Hydroxy (Vit-D  Deficiency, Fractures)     62 year old male with remote history of TIA and history of dyslipidemia.  He has history of B12 and vitamin D deficiency as above.  We discussed several items as follows  -Recommend annual flu vaccine -Obtain labs as above.  Include 25-hydroxy vitamin D and B12 level.  If B12 still low consider IM replacement -Colonoscopy due 2029 -Prior hepatitis C  screen negative -Shingrix completed  No follow-ups on file.    Evelena Peat, MD

## 2023-02-12 NOTE — Patient Instructions (Signed)
Remember flu vaccine this Fall.   

## 2023-02-22 ENCOUNTER — Other Ambulatory Visit: Payer: Self-pay | Admitting: Family Medicine

## 2023-02-22 NOTE — Telephone Encounter (Signed)
Prescription Request  02/22/2023  LOV: 02/12/2023  What is the name of the medication or equipment? atorvastatin (LIPITOR) 10 MG tablet    Pt states he is completely out. Pt aware MD is OOO on Thursdays.   Have you contacted your pharmacy to request a refill? No  Which pharmacy would you like this sent to?   [id number] [place], [place] - [address] [place] [address] Phone: [phone] Fax: [phone]   Patient notified that their request is being sent to the clinical staff for review and that they should receive a response within 2 business days.   Please advise at Mobile [phone] (mobile)

## 2023-02-23 MED ORDER — ATORVASTATIN CALCIUM 10 MG PO TABS: 10.0000 mg | ORAL_TABLET | Freq: Every day | ORAL | 3 refills | Status: DC

## 2023-04-10 ENCOUNTER — Other Ambulatory Visit: Payer: Self-pay | Admitting: Family Medicine

## 2023-04-10 DIAGNOSIS — Z0000 Encounter for general adult medical examination without abnormal findings: Secondary | ICD-10-CM

## 2024-01-19 ENCOUNTER — Other Ambulatory Visit: Payer: Self-pay | Admitting: Family Medicine

## 2024-02-13 ENCOUNTER — Encounter: Payer: Self-pay | Admitting: Family Medicine

## 2024-02-13 ENCOUNTER — Ambulatory Visit (INDEPENDENT_AMBULATORY_CARE_PROVIDER_SITE_OTHER): Payer: Commercial Managed Care - HMO | Admitting: Family Medicine

## 2024-02-13 VITALS — BP 124/80 | HR 73 | Temp 98.3°F | Ht 72.05 in | Wt 222.4 lb

## 2024-02-13 DIAGNOSIS — E559 Vitamin D deficiency, unspecified: Secondary | ICD-10-CM

## 2024-02-13 DIAGNOSIS — E538 Deficiency of other specified B group vitamins: Secondary | ICD-10-CM

## 2024-02-13 DIAGNOSIS — Z23 Encounter for immunization: Secondary | ICD-10-CM | POA: Diagnosis not present

## 2024-02-13 DIAGNOSIS — Z8249 Family history of ischemic heart disease and other diseases of the circulatory system: Secondary | ICD-10-CM | POA: Diagnosis not present

## 2024-02-13 DIAGNOSIS — Z0000 Encounter for general adult medical examination without abnormal findings: Secondary | ICD-10-CM

## 2024-02-13 LAB — HEPATIC FUNCTION PANEL
ALT: 23 U/L (ref 0–53)
AST: 19 U/L (ref 0–37)
Albumin: 4.6 g/dL (ref 3.5–5.2)
Alkaline Phosphatase: 62 U/L (ref 39–117)
Bilirubin, Direct: 0.1 mg/dL (ref 0.0–0.3)
Total Bilirubin: 0.6 mg/dL (ref 0.2–1.2)
Total Protein: 7.6 g/dL (ref 6.0–8.3)

## 2024-02-13 LAB — CBC WITH DIFFERENTIAL/PLATELET
Basophils Absolute: 0.1 K/uL (ref 0.0–0.1)
Basophils Relative: 0.7 % (ref 0.0–3.0)
Eosinophils Absolute: 0.1 K/uL (ref 0.0–0.7)
Eosinophils Relative: 1.8 % (ref 0.0–5.0)
HCT: 48.6 % (ref 39.0–52.0)
Hemoglobin: 16.3 g/dL (ref 13.0–17.0)
Lymphocytes Relative: 18.9 % (ref 12.0–46.0)
Lymphs Abs: 1.6 K/uL (ref 0.7–4.0)
MCHC: 33.4 g/dL (ref 30.0–36.0)
MCV: 88.5 fl (ref 78.0–100.0)
Monocytes Absolute: 0.8 K/uL (ref 0.1–1.0)
Monocytes Relative: 9.8 % (ref 3.0–12.0)
Neutro Abs: 5.8 K/uL (ref 1.4–7.7)
Neutrophils Relative %: 68.8 % (ref 43.0–77.0)
Platelets: 308 K/uL (ref 150.0–400.0)
RBC: 5.49 Mil/uL (ref 4.22–5.81)
RDW: 12.8 % (ref 11.5–15.5)
WBC: 8.5 K/uL (ref 4.0–10.5)

## 2024-02-13 LAB — BASIC METABOLIC PANEL WITH GFR
BUN: 15 mg/dL (ref 6–23)
CO2: 30 meq/L (ref 19–32)
Calcium: 9.8 mg/dL (ref 8.4–10.5)
Chloride: 102 meq/L (ref 96–112)
Creatinine, Ser: 0.94 mg/dL (ref 0.40–1.50)
GFR: 85.38 mL/min (ref >60.00)
Glucose, Bld: 102 mg/dL — ABNORMAL HIGH (ref 70–99)
Potassium: 5.2 meq/L — ABNORMAL HIGH (ref 3.5–5.1)
Sodium: 139 meq/L (ref 135–145)

## 2024-02-13 LAB — LIPID PANEL
Cholesterol: 162 mg/dL (ref 0–200)
HDL: 41.5 mg/dL (ref >39.00)
LDL Cholesterol: 92 mg/dL (ref 0–99)
NonHDL: 120.98
Total CHOL/HDL Ratio: 4
Triglycerides: 146 mg/dL (ref 0.0–149.0)
VLDL: 29.2 mg/dL (ref 0.0–40.0)

## 2024-02-13 LAB — VITAMIN D 25 HYDROXY (VIT D DEFICIENCY, FRACTURES): VITD: 25.74 ng/mL — ABNORMAL LOW (ref 30.00–100.00)

## 2024-02-13 LAB — PSA: PSA: 1.99 ng/mL (ref 0.10–4.00)

## 2024-02-13 LAB — VITAMIN B12: Vitamin B-12: 390 pg/mL (ref 211–911)

## 2024-02-13 NOTE — Patient Instructions (Signed)
 Consider flu and RSV vaccines this Fall.

## 2024-02-13 NOTE — Progress Notes (Signed)
 Established Patient Office Visit  Subjective   Patient ID: Chris Mendez, male    DOB: 1960/12/19  Age: 63 y.o. MRN: [id number]  Chief Complaint  Patient presents with   Annual Exam    HPI   [name] is here today for physical exam.  He has history of hyperlipidemia, TIA, borderline elevated blood pressure.  Currently takes rosuvastatin 10 mg daily.  He is very diligent with exercising.  Usually walks at least 5 days/week frequent hour at a time usually 3-1/2 to 4 miles an hour 7% incline.  No recent chest pains.  His father had CAD age 19.  Health maintenance reviewed:  Health Maintenance  Topic Date Due   Medicare Annual Wellness (AWV)  Never done   HIV Screening  Never done   COVID-19 Vaccine (6 - 2024-25 season) 02/25/2023   INFLUENZA VACCINE  01/25/2024   Colonoscopy  08/28/2027   DTaP/Tdap/Td (5 - Td or Tdap) 03/22/2032   Pneumococcal Vaccine: 50+ Years  Completed   Hepatitis C Screening  Completed   Zoster Vaccines- Shingrix  Completed   Hepatitis B Vaccines 19-59 Average Risk  Aged Out   HPV VACCINES  Aged Out   Meningococcal B Vaccine  Aged Out   - Turning 63 in 6 days.  No history of pneumonia vaccine.  Has also not had RSV.  He plans to get flu vaccine later this fall.  Shingrix complete.  -Colonoscopy due 2029  Family history-mother had history of thyroid cancer   She also had history of hypertension.  She apparently died from complications of kidney failure and congestive heart failure.  His father still alive age 54.  History of bypass around age 38.  He has a brother with hypertension.  Sister who is well.   Social history-retired from [employer] in March 2022.  Has 2 sons.  Never smoked.  Only occasional alcohol use.  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 He has never used smokeless tobacco. He reports current alcohol use. He reports that he does not use drugs. family history includes Cancer in his maternal aunt, maternal grandmother, and mother; Colon cancer (age of onset: 41) in his maternal aunt; Esophageal cancer (age of onset: 31) in his paternal grandfather; Heart disease (age of onset: 35) in his father; Hyperlipidemia in his father; Hypertension in his brother, father, and mother. No Known Allergies  The ASCVD Risk score (Arnett DK, et al., 2019) failed to calculate for the following reasons:   Risk score cannot be calculated because patient has a medical history suggesting prior/existing ASCVD   Review of Systems  Constitutional:  Negative for malaise/fatigue.  Eyes:  Negative for blurred vision.  Respiratory:  Negative for shortness of breath.   Cardiovascular:  Negative for chest pain.  Gastrointestinal:  Negative for abdominal pain.  Neurological:  Negative for dizziness, weakness and headaches.      Objective:     BP 124/80   Pulse 73   Temp 98.3 F (36.8 C) (Oral)   Ht 6' 0.05 (1.83 m)   Wt 222 lb 6.4 oz (100.9 kg)  SpO2 94%   BMI 30.12 kg/m  BP Readings from Last 3 Encounters:  02/13/24 124/80  02/12/23 126/76  12/28/21 110/82   Wt Readings from Last 3 Encounters:  02/13/24 222 lb 6.4 oz (100.9 kg)  02/12/23 218 lb 12.8 oz (99.2 kg)  12/28/21 220 lb 9.6 oz (100.1 kg)      Physical Exam Vitals reviewed.  Constitutional:      General: He is not in acute distress.    Appearance: He is well-developed. He is not ill-appearing.  HENT:     Head: Normocephalic and atraumatic.     Right Ear: External ear normal.     Left Ear: External ear normal.  Eyes:     Conjunctiva/sclera: Conjunctivae normal.     Pupils: Pupils are equal, round, and reactive to light.  Neck:      Thyroid: No thyromegaly.     Comments: No carotid bruits Cardiovascular:     Rate and Rhythm: Normal rate and regular rhythm.     Heart sounds: Normal heart sounds. No murmur heard. Pulmonary:     Effort: No respiratory distress.     Breath sounds: No wheezing or rales.  Abdominal:     General: Bowel sounds are normal. There is no distension.     Palpations: Abdomen is soft. There is no mass.     Tenderness: There is no abdominal tenderness. There is no guarding or rebound.  Musculoskeletal:     Cervical back: Normal range of motion and neck supple.     Right lower leg: No edema.     Left lower leg: No edema.  Lymphadenopathy:     Cervical: No cervical adenopathy.  Skin:    Findings: No rash.  Neurological:     Mental Status: He is alert and oriented to person, place, and time.     Cranial Nerves: No cranial nerve deficit.      No results found for any visits on 02/13/24.    The ASCVD Risk score (Arnett DK, et al., 2019) failed to calculate for the following reasons:   Risk score cannot be calculated because patient has a medical history suggesting prior/existing ASCVD    Assessment & Plan:   Problem List Items Addressed This Visit   None Visit Diagnoses       Physical exam    -  Primary   Relevant Orders   Basic metabolic panel with GFR   Lipid panel   CBC with Differential/Platelet   Hepatic function panel   VITAMIN D  25 Hydroxy (Vit-D Deficiency, Fractures)   Vitamin B12   PSA     Vitamin D  deficiency       Relevant Orders   VITAMIN D  25 Hydroxy (Vit-D Deficiency, Fractures)     B12 deficiency       Relevant Orders   Vitamin B12     Family history of early CAD       Relevant Orders   CT CARDIAC SCORING (SELF PAY ONLY)     Need for pneumococcal vaccination       Relevant Orders   Pneumococcal conjugate vaccine 20-valent (Prevnar 20) (Completed)     [name] is here for physical exam.  He has chronic medical problems as above.  We discussed several  health maintenance issues as follows  -Recommend annual flu vaccine and he plans to get this later this fall - Consider RSV vaccine - Prevnar 20 given today - Discussed possible coronary calcium  score specially given family history of CAD  in his father.  He would like to proceed with that. - Obtain labs as above.  He has history of both vitamin D  and B12 deficiency and will check both of those in addition to other labs above. - Continue regular exercise habits.  He is currently getting a very good level of exercise with walking briskly on an incline for about an hour 5 days a week or so - He plans to establish with new dermatologist soon for skin cancer screening  No follow-ups on file.    Wolm Scarlet, MD

## 2024-02-14 ENCOUNTER — Ambulatory Visit: Payer: Self-pay | Admitting: Family Medicine

## 2024-02-14 DIAGNOSIS — R931 Abnormal findings on diagnostic imaging of heart and coronary circulation: Secondary | ICD-10-CM

## 2024-02-27 ENCOUNTER — Ambulatory Visit (HOSPITAL_BASED_OUTPATIENT_CLINIC_OR_DEPARTMENT_OTHER)
Admission: RE | Admit: 2024-02-27 | Discharge: 2024-02-27 | Disposition: A | Discharge status: Home / Self Care | Payer: Self-pay | Source: Ambulatory Visit | Attending: Family Medicine | Admitting: Family Medicine

## 2024-02-27 DIAGNOSIS — Z8249 Family history of ischemic heart disease and other diseases of the circulatory system: Secondary | ICD-10-CM | POA: Insufficient documentation

## 2024-02-29 ENCOUNTER — Telehealth: Payer: Self-pay

## 2024-02-29 MED ORDER — ATORVASTATIN CALCIUM 20 MG PO TABS: 20.0000 mg | ORAL_TABLET | Freq: Every day | ORAL | 0 refills | Status: DC

## 2024-02-29 NOTE — Telephone Encounter (Signed)
 Rx done.

## 2024-02-29 NOTE — Telephone Encounter (Signed)
 Copied from CRM [id number]. Topic: Clinical - Prescription Issue >> Feb 29, 2024 10:07 AM Dedra B wrote: Reason for CRM: Pt atorvastatin  was sent to [facility] today. He wants it sent to the [facility] on file because his prescriptions are no longer covered at [facility]. Pls message pt when it is completed.

## 2024-02-29 NOTE — Addendum Note (Signed)
 Addended by: METTA KRISTEN CROME on: 02/29/2024 09:06 AM   Modules accepted: Orders

## 2024-03-04 DIAGNOSIS — H5213 Myopia, bilateral: Secondary | ICD-10-CM | POA: Diagnosis not present

## 2024-03-13 DIAGNOSIS — L728 Other follicular cysts of the skin and subcutaneous tissue: Secondary | ICD-10-CM | POA: Diagnosis not present

## 2024-03-13 DIAGNOSIS — L814 Other melanin hyperpigmentation: Secondary | ICD-10-CM | POA: Diagnosis not present

## 2024-03-13 DIAGNOSIS — L821 Other seborrheic keratosis: Secondary | ICD-10-CM | POA: Diagnosis not present

## 2024-03-13 DIAGNOSIS — L570 Actinic keratosis: Secondary | ICD-10-CM | POA: Diagnosis not present

## 2024-03-13 DIAGNOSIS — D1801 Hemangioma of skin and subcutaneous tissue: Secondary | ICD-10-CM | POA: Diagnosis not present

## 2024-04-09 ENCOUNTER — Telehealth: Payer: Self-pay

## 2024-04-09 MED ORDER — ATORVASTATIN CALCIUM 20 MG PO TABS: 20.0000 mg | ORAL_TABLET | Freq: Every day | ORAL | 2 refills | Status: DC

## 2024-04-09 NOTE — Telephone Encounter (Signed)
R done

## 2024-04-09 NOTE — Telephone Encounter (Signed)
 Copied from CRM #[id number]. Topic: Clinical - Medication Question >> Apr 09, 2024 11:55 AM Revonda D wrote: Reason for CRM: Pt stated that he went to go pick up his medication for the atorvastatin  (LIPITOR) 20 MG tablet and [facility] informed him that they never received an order for that medication. Pt would like for the office to resend the medication request and would like a callback with an update.

## 2024-04-20 ENCOUNTER — Other Ambulatory Visit: Payer: Self-pay | Admitting: Family Medicine

## 2024-07-05 ENCOUNTER — Other Ambulatory Visit: Payer: Self-pay | Admitting: Family Medicine
